# Patient Record
Sex: Female | Born: 1980
Health system: Southern US, Community
[De-identification: ages and names within clinical notes are randomized; demographics above are authoritative.]

## PROBLEM LIST (undated history)

## (undated) DIAGNOSIS — E785 Hyperlipidemia, unspecified: Secondary | ICD-10-CM

## (undated) DIAGNOSIS — H409 Unspecified glaucoma: Secondary | ICD-10-CM

## (undated) DIAGNOSIS — M329 Systemic lupus erythematosus, unspecified: Secondary | ICD-10-CM

## (undated) DIAGNOSIS — M1990 Unspecified osteoarthritis, unspecified site: Secondary | ICD-10-CM

## (undated) DIAGNOSIS — T7840XA Allergy, unspecified, initial encounter: Secondary | ICD-10-CM

## (undated) DIAGNOSIS — D649 Anemia, unspecified: Secondary | ICD-10-CM

## (undated) DIAGNOSIS — R87629 Unspecified abnormal cytological findings in specimens from vagina: Secondary | ICD-10-CM

## (undated) DIAGNOSIS — I639 Cerebral infarction, unspecified: Secondary | ICD-10-CM

## (undated) HISTORY — DX: Cerebral infarction, unspecified: I63.9

## (undated) HISTORY — PX: OTHER SURGICAL HISTORY: SHX169

## (undated) HISTORY — DX: Hyperlipidemia, unspecified: E78.5

## (undated) HISTORY — DX: Unspecified abnormal cytological findings in specimens from vagina: R87.629

## (undated) HISTORY — DX: Unspecified glaucoma: H40.9

## (undated) HISTORY — DX: Allergy, unspecified, initial encounter: T78.40XA

## (undated) HISTORY — DX: Anemia, unspecified: D64.9

## (undated) HISTORY — PX: CHOLECYSTECTOMY: SHX55

## (undated) HISTORY — DX: Unspecified osteoarthritis, unspecified site: M19.90

---

## 2016-11-15 DIAGNOSIS — M25572 Pain in left ankle and joints of left foot: Secondary | ICD-10-CM | POA: Diagnosis not present

## 2016-11-18 DIAGNOSIS — M25572 Pain in left ankle and joints of left foot: Secondary | ICD-10-CM | POA: Diagnosis not present

## 2016-12-10 DIAGNOSIS — M25572 Pain in left ankle and joints of left foot: Secondary | ICD-10-CM | POA: Diagnosis not present

## 2016-12-30 DIAGNOSIS — M25572 Pain in left ankle and joints of left foot: Secondary | ICD-10-CM | POA: Diagnosis not present

## 2017-01-02 DIAGNOSIS — M25572 Pain in left ankle and joints of left foot: Secondary | ICD-10-CM | POA: Diagnosis not present

## 2017-01-06 DIAGNOSIS — K080 Exfoliation of teeth due to systemic causes: Secondary | ICD-10-CM | POA: Diagnosis not present

## 2017-01-08 DIAGNOSIS — M25572 Pain in left ankle and joints of left foot: Secondary | ICD-10-CM | POA: Diagnosis not present

## 2017-01-09 DIAGNOSIS — M25572 Pain in left ankle and joints of left foot: Secondary | ICD-10-CM | POA: Diagnosis not present

## 2017-01-13 DIAGNOSIS — M25572 Pain in left ankle and joints of left foot: Secondary | ICD-10-CM | POA: Diagnosis not present

## 2017-01-15 DIAGNOSIS — M25572 Pain in left ankle and joints of left foot: Secondary | ICD-10-CM | POA: Diagnosis not present

## 2017-01-16 DIAGNOSIS — R1010 Upper abdominal pain, unspecified: Secondary | ICD-10-CM | POA: Diagnosis not present

## 2017-01-16 DIAGNOSIS — K8051 Calculus of bile duct without cholangitis or cholecystitis with obstruction: Secondary | ICD-10-CM | POA: Diagnosis not present

## 2017-01-16 DIAGNOSIS — R112 Nausea with vomiting, unspecified: Secondary | ICD-10-CM | POA: Diagnosis not present

## 2017-01-16 DIAGNOSIS — K8020 Calculus of gallbladder without cholecystitis without obstruction: Secondary | ICD-10-CM | POA: Diagnosis not present

## 2017-01-16 DIAGNOSIS — R1013 Epigastric pain: Secondary | ICD-10-CM | POA: Diagnosis not present

## 2017-01-16 DIAGNOSIS — R030 Elevated blood-pressure reading, without diagnosis of hypertension: Secondary | ICD-10-CM | POA: Diagnosis not present

## 2017-01-16 DIAGNOSIS — K8050 Calculus of bile duct without cholangitis or cholecystitis without obstruction: Secondary | ICD-10-CM | POA: Diagnosis not present

## 2017-01-20 DIAGNOSIS — M25572 Pain in left ankle and joints of left foot: Secondary | ICD-10-CM | POA: Diagnosis not present

## 2017-01-27 DIAGNOSIS — K8020 Calculus of gallbladder without cholecystitis without obstruction: Secondary | ICD-10-CM | POA: Diagnosis not present

## 2017-02-10 DIAGNOSIS — M76829 Posterior tibial tendinitis, unspecified leg: Secondary | ICD-10-CM | POA: Diagnosis not present

## 2017-02-10 DIAGNOSIS — M25572 Pain in left ankle and joints of left foot: Secondary | ICD-10-CM | POA: Diagnosis not present

## 2017-02-10 DIAGNOSIS — M21079 Valgus deformity, not elsewhere classified, unspecified ankle: Secondary | ICD-10-CM | POA: Diagnosis not present

## 2017-02-27 DIAGNOSIS — K8020 Calculus of gallbladder without cholecystitis without obstruction: Secondary | ICD-10-CM | POA: Diagnosis not present

## 2017-02-27 DIAGNOSIS — K819 Cholecystitis, unspecified: Secondary | ICD-10-CM | POA: Diagnosis not present

## 2017-03-07 DIAGNOSIS — M2142 Flat foot [pes planus] (acquired), left foot: Secondary | ICD-10-CM | POA: Diagnosis not present

## 2017-04-14 DIAGNOSIS — M21079 Valgus deformity, not elsewhere classified, unspecified ankle: Secondary | ICD-10-CM | POA: Diagnosis not present

## 2017-04-14 DIAGNOSIS — Z3202 Encounter for pregnancy test, result negative: Secondary | ICD-10-CM | POA: Diagnosis not present

## 2017-04-14 DIAGNOSIS — Z0000 Encounter for general adult medical examination without abnormal findings: Secondary | ICD-10-CM | POA: Diagnosis not present

## 2017-04-14 DIAGNOSIS — M76829 Posterior tibial tendinitis, unspecified leg: Secondary | ICD-10-CM | POA: Diagnosis not present

## 2017-04-14 DIAGNOSIS — Z309 Encounter for contraceptive management, unspecified: Secondary | ICD-10-CM | POA: Diagnosis not present

## 2017-04-14 DIAGNOSIS — M25572 Pain in left ankle and joints of left foot: Secondary | ICD-10-CM | POA: Diagnosis not present

## 2017-04-14 DIAGNOSIS — Z113 Encounter for screening for infections with a predominantly sexual mode of transmission: Secondary | ICD-10-CM | POA: Diagnosis not present

## 2017-06-24 DIAGNOSIS — M25572 Pain in left ankle and joints of left foot: Secondary | ICD-10-CM | POA: Diagnosis not present

## 2017-07-21 DIAGNOSIS — K080 Exfoliation of teeth due to systemic causes: Secondary | ICD-10-CM | POA: Diagnosis not present

## 2018-03-30 DIAGNOSIS — D22111 Melanocytic nevi of right upper eyelid, including canthus: Secondary | ICD-10-CM | POA: Diagnosis not present

## 2018-04-23 DIAGNOSIS — R87610 Atypical squamous cells of undetermined significance on cytologic smear of cervix (ASC-US): Secondary | ICD-10-CM | POA: Diagnosis not present

## 2018-04-23 DIAGNOSIS — Z01419 Encounter for gynecological examination (general) (routine) without abnormal findings: Secondary | ICD-10-CM | POA: Diagnosis not present

## 2018-04-24 ENCOUNTER — Other Ambulatory Visit: Payer: Self-pay | Admitting: Obstetrics & Gynecology

## 2018-04-24 DIAGNOSIS — Z1231 Encounter for screening mammogram for malignant neoplasm of breast: Secondary | ICD-10-CM

## 2018-05-14 DIAGNOSIS — R87810 Cervical high risk human papillomavirus (HPV) DNA test positive: Secondary | ICD-10-CM | POA: Diagnosis not present

## 2018-05-14 DIAGNOSIS — Z3202 Encounter for pregnancy test, result negative: Secondary | ICD-10-CM | POA: Diagnosis not present

## 2018-05-14 DIAGNOSIS — N72 Inflammatory disease of cervix uteri: Secondary | ICD-10-CM | POA: Diagnosis not present

## 2018-05-14 DIAGNOSIS — R87610 Atypical squamous cells of undetermined significance on cytologic smear of cervix (ASC-US): Secondary | ICD-10-CM | POA: Diagnosis not present

## 2018-06-16 ENCOUNTER — Ambulatory Visit
Admission: RE | Admit: 2018-06-16 | Discharge: 2018-06-16 | Disposition: A | Discharge status: Home / Self Care | Payer: Federal, State, Local not specified - PPO | Source: Ambulatory Visit | Attending: Obstetrics & Gynecology | Admitting: Obstetrics & Gynecology

## 2018-06-16 ENCOUNTER — Other Ambulatory Visit: Payer: Self-pay

## 2018-06-16 DIAGNOSIS — Z1231 Encounter for screening mammogram for malignant neoplasm of breast: Secondary | ICD-10-CM | POA: Diagnosis not present

## 2018-06-16 DIAGNOSIS — Z8249 Family history of ischemic heart disease and other diseases of the circulatory system: Secondary | ICD-10-CM | POA: Diagnosis not present

## 2018-06-16 DIAGNOSIS — Z833 Family history of diabetes mellitus: Secondary | ICD-10-CM | POA: Diagnosis not present

## 2018-06-16 DIAGNOSIS — R635 Abnormal weight gain: Secondary | ICD-10-CM | POA: Diagnosis not present

## 2018-06-18 ENCOUNTER — Other Ambulatory Visit: Payer: Self-pay | Admitting: Obstetrics & Gynecology

## 2018-06-18 DIAGNOSIS — R928 Other abnormal and inconclusive findings on diagnostic imaging of breast: Secondary | ICD-10-CM

## 2018-06-25 ENCOUNTER — Ambulatory Visit
Admission: RE | Admit: 2018-06-25 | Discharge: 2018-06-25 | Disposition: A | Discharge status: Home / Self Care | Payer: Federal, State, Local not specified - PPO | Source: Ambulatory Visit | Attending: Obstetrics & Gynecology | Admitting: Obstetrics & Gynecology

## 2018-06-25 ENCOUNTER — Other Ambulatory Visit: Payer: Self-pay

## 2018-06-25 DIAGNOSIS — R928 Other abnormal and inconclusive findings on diagnostic imaging of breast: Secondary | ICD-10-CM

## 2018-06-25 DIAGNOSIS — N6012 Diffuse cystic mastopathy of left breast: Secondary | ICD-10-CM | POA: Diagnosis not present

## 2018-06-25 DIAGNOSIS — N6459 Other signs and symptoms in breast: Secondary | ICD-10-CM | POA: Diagnosis not present

## 2018-07-07 DIAGNOSIS — Z833 Family history of diabetes mellitus: Secondary | ICD-10-CM | POA: Diagnosis not present

## 2018-07-07 DIAGNOSIS — R635 Abnormal weight gain: Secondary | ICD-10-CM | POA: Diagnosis not present

## 2018-07-07 DIAGNOSIS — Z8249 Family history of ischemic heart disease and other diseases of the circulatory system: Secondary | ICD-10-CM | POA: Diagnosis not present

## 2018-07-13 DIAGNOSIS — Z833 Family history of diabetes mellitus: Secondary | ICD-10-CM | POA: Diagnosis not present

## 2018-07-13 DIAGNOSIS — Z0000 Encounter for general adult medical examination without abnormal findings: Secondary | ICD-10-CM | POA: Diagnosis not present

## 2018-07-13 DIAGNOSIS — R635 Abnormal weight gain: Secondary | ICD-10-CM | POA: Diagnosis not present

## 2018-11-13 DIAGNOSIS — R87612 Low grade squamous intraepithelial lesion on cytologic smear of cervix (LGSIL): Secondary | ICD-10-CM | POA: Diagnosis not present

## 2018-11-13 DIAGNOSIS — N879 Dysplasia of cervix uteri, unspecified: Secondary | ICD-10-CM | POA: Diagnosis not present

## 2019-05-18 ENCOUNTER — Other Ambulatory Visit: Payer: Self-pay | Admitting: Obstetrics & Gynecology

## 2019-05-18 DIAGNOSIS — Z1231 Encounter for screening mammogram for malignant neoplasm of breast: Secondary | ICD-10-CM

## 2019-06-07 DIAGNOSIS — Z6841 Body Mass Index (BMI) 40.0 and over, adult: Secondary | ICD-10-CM | POA: Diagnosis not present

## 2019-06-07 DIAGNOSIS — Z0000 Encounter for general adult medical examination without abnormal findings: Secondary | ICD-10-CM | POA: Diagnosis not present

## 2019-06-07 DIAGNOSIS — Z833 Family history of diabetes mellitus: Secondary | ICD-10-CM | POA: Diagnosis not present

## 2019-06-07 DIAGNOSIS — Z23 Encounter for immunization: Secondary | ICD-10-CM | POA: Diagnosis not present

## 2019-06-08 DIAGNOSIS — R87810 Cervical high risk human papillomavirus (HPV) DNA test positive: Secondary | ICD-10-CM | POA: Insufficient documentation

## 2019-06-08 DIAGNOSIS — R928 Other abnormal and inconclusive findings on diagnostic imaging of breast: Secondary | ICD-10-CM | POA: Insufficient documentation

## 2019-06-08 DIAGNOSIS — Z8249 Family history of ischemic heart disease and other diseases of the circulatory system: Secondary | ICD-10-CM | POA: Insufficient documentation

## 2019-06-08 DIAGNOSIS — E66812 Obesity, class 2: Secondary | ICD-10-CM | POA: Insufficient documentation

## 2019-06-08 DIAGNOSIS — Z833 Family history of diabetes mellitus: Secondary | ICD-10-CM | POA: Insufficient documentation

## 2019-06-11 DIAGNOSIS — Z83438 Family history of other disorder of lipoprotein metabolism and other lipidemia: Secondary | ICD-10-CM | POA: Diagnosis not present

## 2019-06-11 DIAGNOSIS — Z833 Family history of diabetes mellitus: Secondary | ICD-10-CM | POA: Diagnosis not present

## 2019-06-11 DIAGNOSIS — Z0000 Encounter for general adult medical examination without abnormal findings: Secondary | ICD-10-CM | POA: Diagnosis not present

## 2019-06-18 ENCOUNTER — Ambulatory Visit
Admission: RE | Admit: 2019-06-18 | Discharge: 2019-06-18 | Disposition: A | Discharge status: Home / Self Care | Payer: Federal, State, Local not specified - PPO | Source: Ambulatory Visit | Attending: Obstetrics & Gynecology | Admitting: Obstetrics & Gynecology

## 2019-06-18 ENCOUNTER — Other Ambulatory Visit: Payer: Self-pay

## 2019-06-18 DIAGNOSIS — Z1231 Encounter for screening mammogram for malignant neoplasm of breast: Secondary | ICD-10-CM

## 2019-07-13 DIAGNOSIS — Z20822 Contact with and (suspected) exposure to covid-19: Secondary | ICD-10-CM | POA: Diagnosis not present

## 2019-08-07 IMAGING — US ULTRASOUND LEFT BREAST LIMITED
1 series · 14 of 15 positions shown · non-contrast
Comparison: Baseline screening mammogram dated 06/16/2018.

CLINICAL DATA: Patient returns today to evaluate masses identified
within each breast on baseline screening mammogram.

EXAM:
ULTRASOUND OF THE BILATERAL BREAST

[Series 1: ultrasound left breast limited · 0.06mm/px · 14 of 15 slices shown]
[im 1/15]
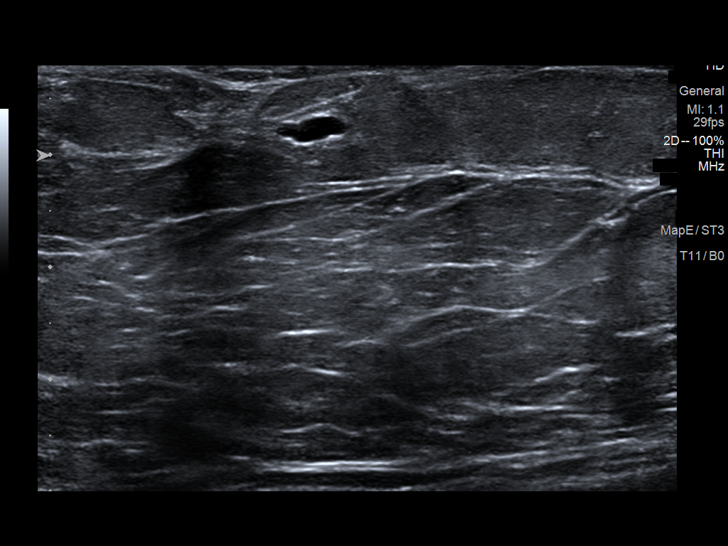
[im 2/15]
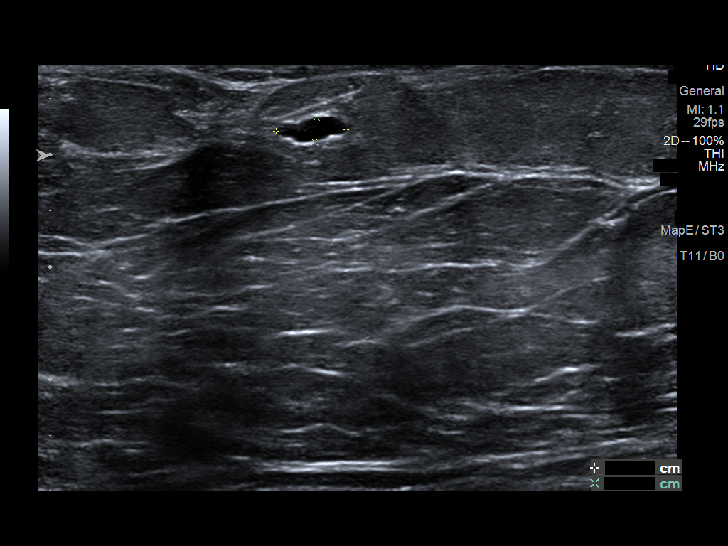
[im 3/15]
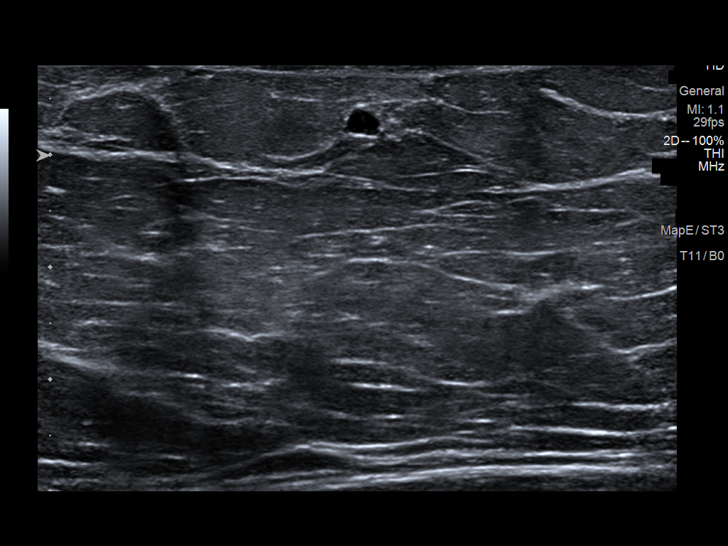
[im 4/15]
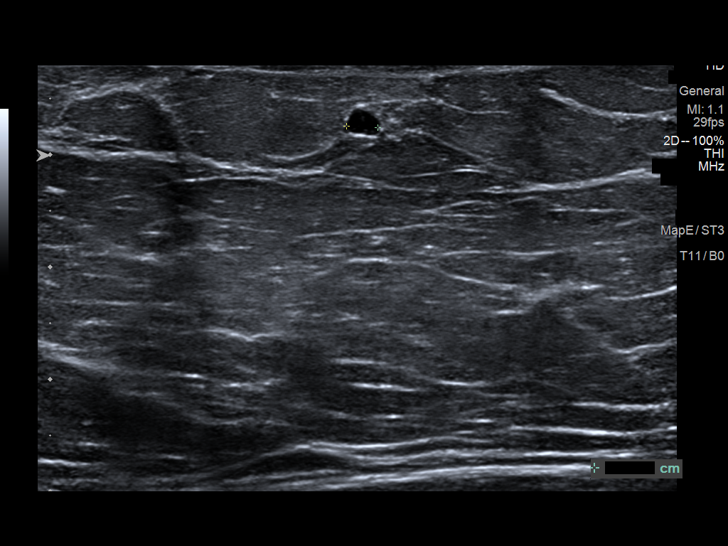
[im 5/15]
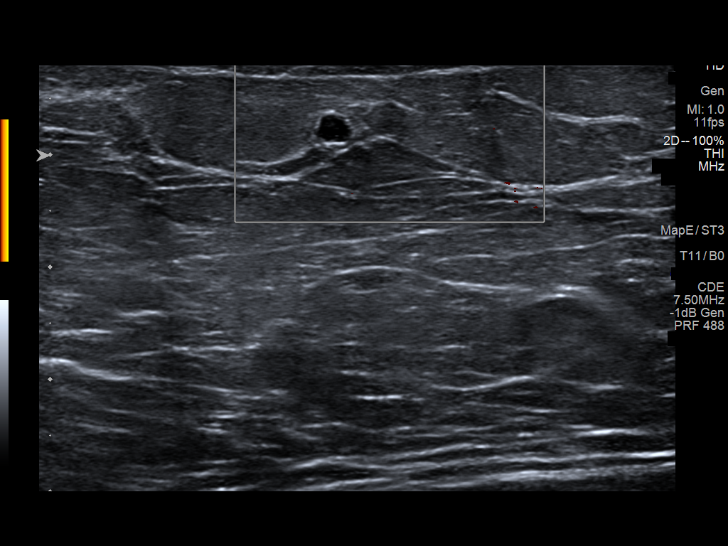
[im 6/15]
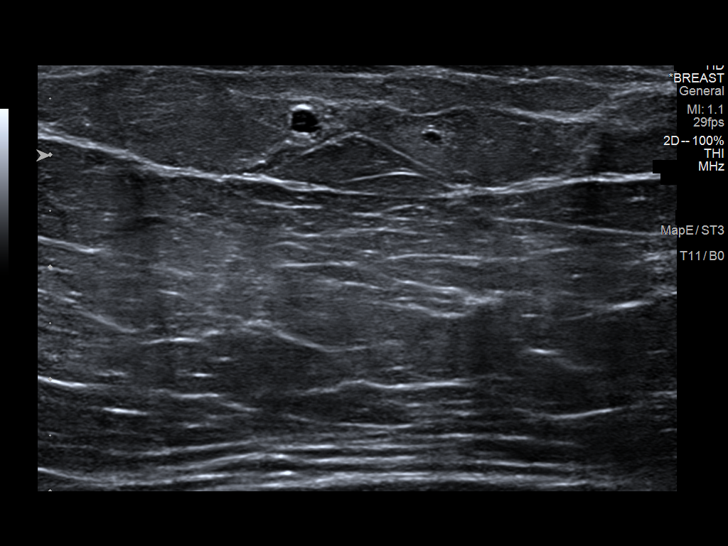
[im 7/15]
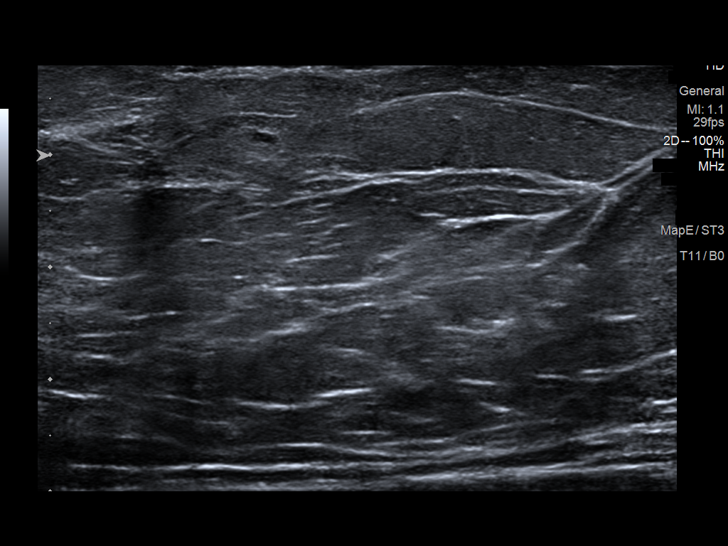
[im 9/15]
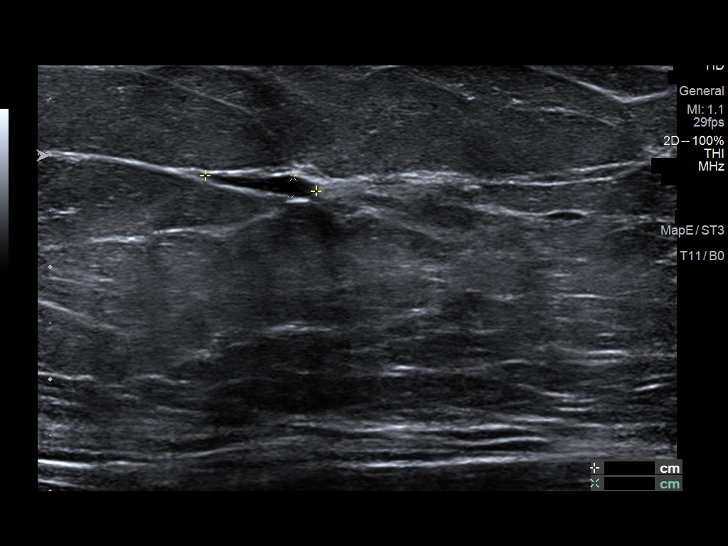
[im 10/15]
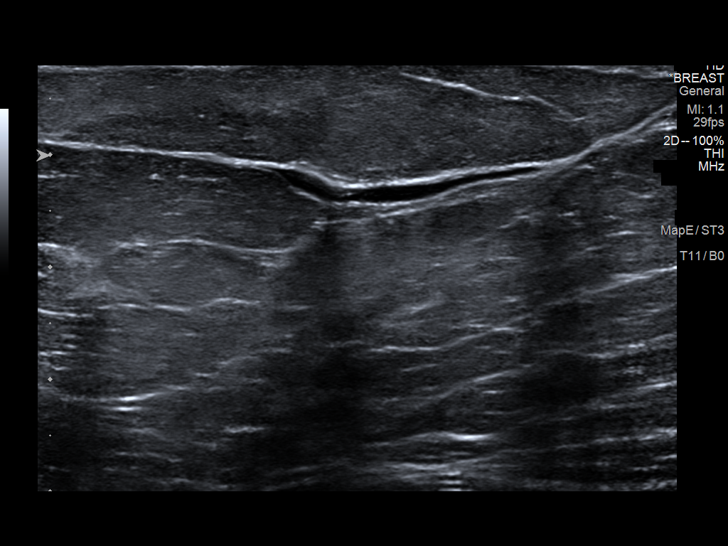
[im 11/15]
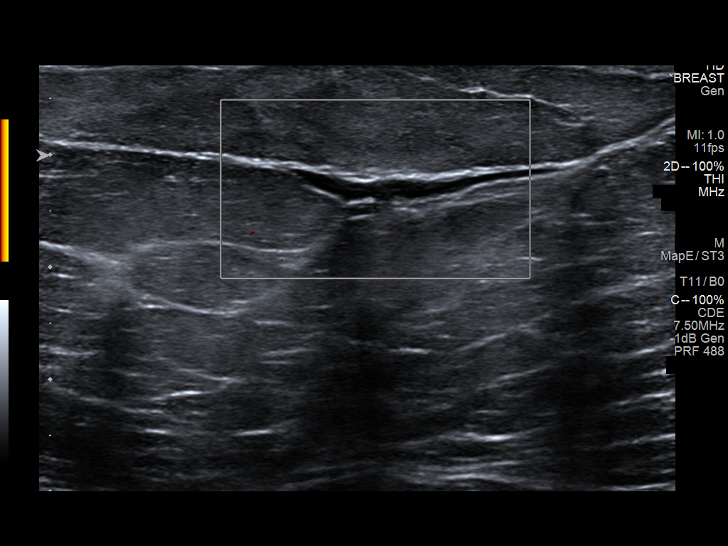
[im 12/15]
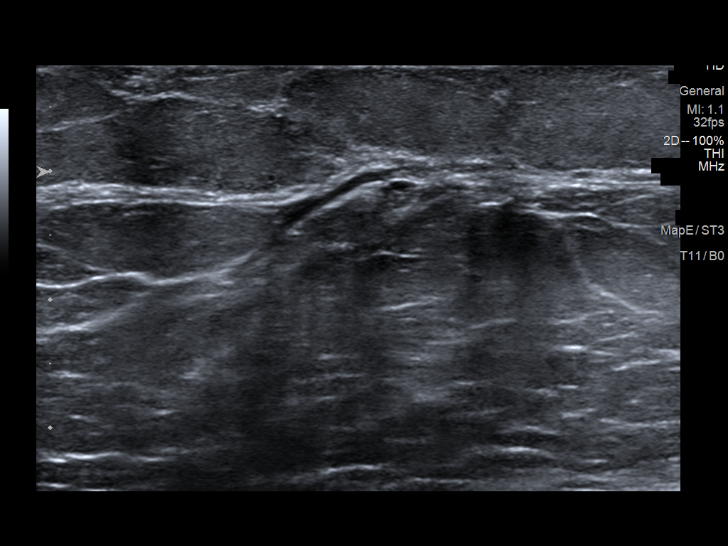
[im 13/15]
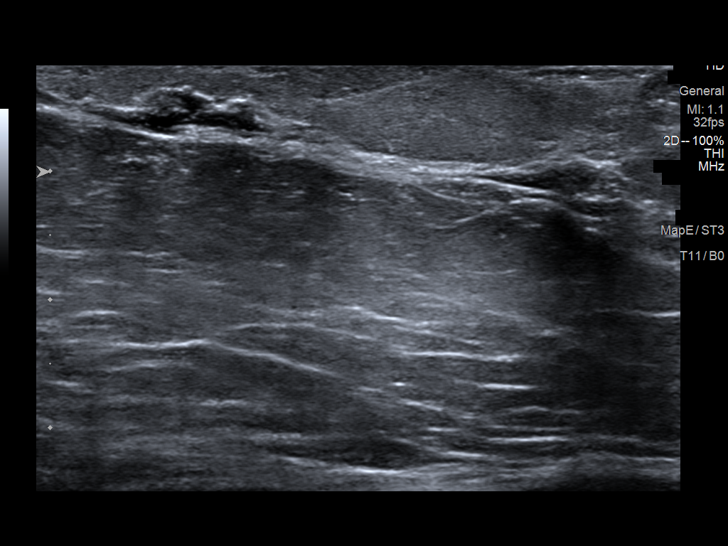
[im 14/15]
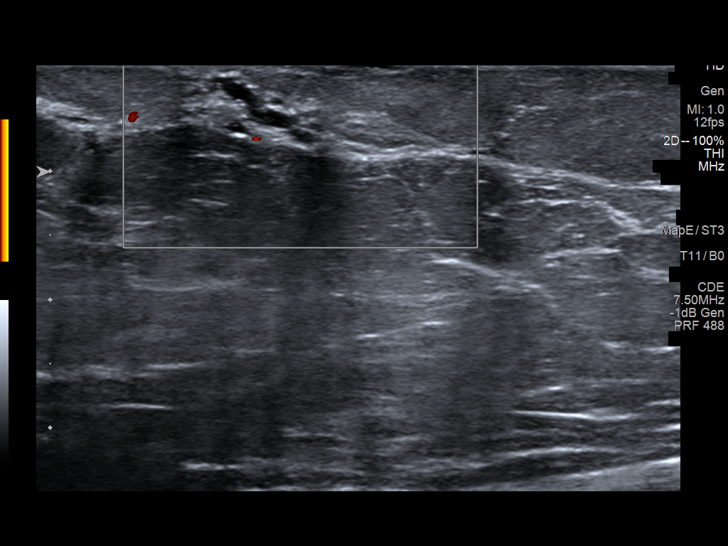
[im 15/15]
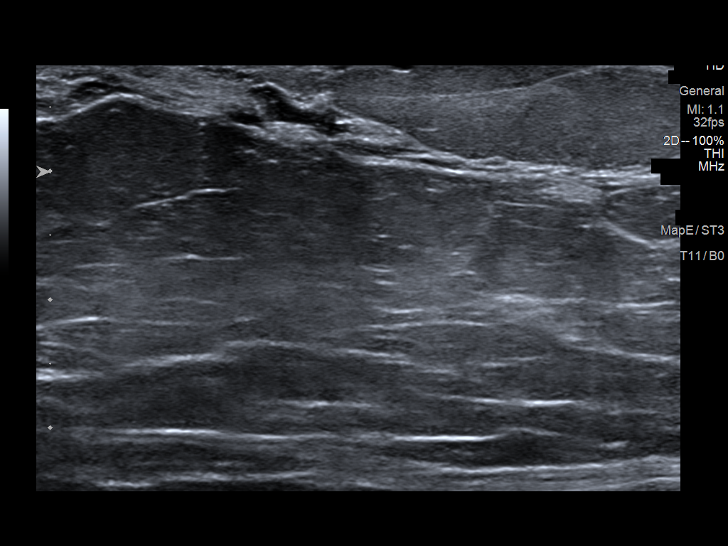

[14 of 15 positions shown; findings below may reference images not displayed]

FINDINGS: RIGHT breast: Targeted ultrasound is performed, showing 2
morphologically normal intramammary lymph nodes in the outer RIGHT
breast. No suspicious solid or cystic mass.

LEFT breast: Targeted ultrasound is performed, showing multiple
benign cysts, corresponding to the mammographic findings. No
suspicious solid or cystic mass.
IMPRESSION: No evidence of malignancy within either breast. Benign lymph nodes
within the outer RIGHT breast and benign cysts within the LEFT
breast.

Patient may return to routine annual bilateral screening mammogram
schedule.

RECOMMENDATION:
Screening mammogram in one year.(Code:CN-B-506)

I have discussed the findings and recommendations with the patient.
Results were also provided in writing at the conclusion of the
visit. If applicable, a reminder letter will be sent to the patient
regarding the next appointment.

BI-RADS CATEGORY  2: Benign.

## 2019-12-09 DIAGNOSIS — E7800 Pure hypercholesterolemia, unspecified: Secondary | ICD-10-CM | POA: Diagnosis not present

## 2019-12-09 DIAGNOSIS — Z6841 Body Mass Index (BMI) 40.0 and over, adult: Secondary | ICD-10-CM | POA: Diagnosis not present

## 2019-12-09 DIAGNOSIS — R748 Abnormal levels of other serum enzymes: Secondary | ICD-10-CM | POA: Diagnosis not present

## 2019-12-14 DIAGNOSIS — Z01419 Encounter for gynecological examination (general) (routine) without abnormal findings: Secondary | ICD-10-CM | POA: Diagnosis not present

## 2019-12-14 DIAGNOSIS — R87610 Atypical squamous cells of undetermined significance on cytologic smear of cervix (ASC-US): Secondary | ICD-10-CM | POA: Diagnosis not present

## 2020-03-14 DIAGNOSIS — Z6841 Body Mass Index (BMI) 40.0 and over, adult: Secondary | ICD-10-CM | POA: Diagnosis not present

## 2020-03-14 DIAGNOSIS — E7800 Pure hypercholesterolemia, unspecified: Secondary | ICD-10-CM | POA: Insufficient documentation

## 2020-03-14 DIAGNOSIS — R748 Abnormal levels of other serum enzymes: Secondary | ICD-10-CM | POA: Diagnosis not present

## 2020-05-17 ENCOUNTER — Other Ambulatory Visit: Payer: Self-pay | Admitting: Internal Medicine

## 2020-05-17 DIAGNOSIS — I82402 Acute embolism and thrombosis of unspecified deep veins of left lower extremity: Secondary | ICD-10-CM

## 2020-06-13 DIAGNOSIS — E7800 Pure hypercholesterolemia, unspecified: Secondary | ICD-10-CM | POA: Diagnosis not present

## 2020-06-13 DIAGNOSIS — Z8249 Family history of ischemic heart disease and other diseases of the circulatory system: Secondary | ICD-10-CM | POA: Diagnosis not present

## 2020-06-13 DIAGNOSIS — Z1159 Encounter for screening for other viral diseases: Secondary | ICD-10-CM | POA: Diagnosis not present

## 2020-06-13 DIAGNOSIS — Z833 Family history of diabetes mellitus: Secondary | ICD-10-CM | POA: Diagnosis not present

## 2020-06-13 DIAGNOSIS — Z0000 Encounter for general adult medical examination without abnormal findings: Secondary | ICD-10-CM | POA: Diagnosis not present

## 2020-06-13 DIAGNOSIS — Z6841 Body Mass Index (BMI) 40.0 and over, adult: Secondary | ICD-10-CM | POA: Diagnosis not present

## 2020-06-13 DIAGNOSIS — D649 Anemia, unspecified: Secondary | ICD-10-CM | POA: Diagnosis not present

## 2020-06-13 DIAGNOSIS — R87810 Cervical high risk human papillomavirus (HPV) DNA test positive: Secondary | ICD-10-CM | POA: Diagnosis not present

## 2020-06-13 DIAGNOSIS — Z9049 Acquired absence of other specified parts of digestive tract: Secondary | ICD-10-CM | POA: Diagnosis not present

## 2020-07-06 ENCOUNTER — Ambulatory Visit: Payer: Federal, State, Local not specified - PPO

## 2020-08-24 ENCOUNTER — Other Ambulatory Visit: Payer: Self-pay

## 2020-08-24 ENCOUNTER — Ambulatory Visit
Admission: RE | Admit: 2020-08-24 | Discharge: 2020-08-24 | Disposition: A | Discharge status: Home / Self Care | Payer: Federal, State, Local not specified - PPO | Source: Ambulatory Visit | Attending: Internal Medicine | Admitting: Internal Medicine

## 2020-08-24 DIAGNOSIS — Z1231 Encounter for screening mammogram for malignant neoplasm of breast: Secondary | ICD-10-CM | POA: Diagnosis not present

## 2020-08-24 DIAGNOSIS — I82402 Acute embolism and thrombosis of unspecified deep veins of left lower extremity: Secondary | ICD-10-CM

## 2020-12-15 ENCOUNTER — Encounter: Payer: Self-pay | Admitting: Family Medicine

## 2020-12-15 ENCOUNTER — Other Ambulatory Visit: Payer: Self-pay

## 2020-12-15 ENCOUNTER — Ambulatory Visit (INDEPENDENT_AMBULATORY_CARE_PROVIDER_SITE_OTHER): Payer: Federal, State, Local not specified - PPO | Admitting: Family Medicine

## 2020-12-15 ENCOUNTER — Other Ambulatory Visit (HOSPITAL_COMMUNITY)
Admission: RE | Admit: 2020-12-15 | Discharge: 2020-12-15 | Disposition: A | Discharge status: Home / Self Care | Payer: Federal, State, Local not specified - PPO | Source: Ambulatory Visit | Attending: Family Medicine | Admitting: Family Medicine

## 2020-12-15 VITALS — BP 118/74 | HR 73 | Ht 65.0 in | Wt 241.0 lb

## 2020-12-15 DIAGNOSIS — Z01419 Encounter for gynecological examination (general) (routine) without abnormal findings: Secondary | ICD-10-CM | POA: Diagnosis not present

## 2020-12-15 DIAGNOSIS — Z0000 Encounter for general adult medical examination without abnormal findings: Secondary | ICD-10-CM | POA: Insufficient documentation

## 2020-12-15 DIAGNOSIS — Z8742 Personal history of other diseases of the female genital tract: Secondary | ICD-10-CM | POA: Diagnosis not present

## 2020-12-15 MED ORDER — LEVONORGESTREL-ETHINYL ESTRAD 0.1-20 MG-MCG PO TABS: 1.0000 | ORAL_TABLET | Freq: Every day | ORAL | 3 refills | Status: DC

## 2020-12-15 NOTE — Progress Notes (Signed)
GYNECOLOGY ANNUAL PREVENTATIVE CARE ENCOUNTER NOTE  Subjective:   Margaret Hudson is a 40 y.o. G73P0010 female here for a routine annual gynecologic exam.  Current complaints: none.  She has a history of abnormal PAP smear and had a colposcopy with a biopsy a couple years ago. Her last PAP was last year and was also abnormal. It was recommended to get another biopsy, but the patient declined and she was fired from that practice. Denies abnormal vaginal bleeding, discharge, pelvic pain, problems with intercourse or other gynecologic concerns.    Gynecologic History Patient's last menstrual period was 12/15/2020. Patient is not sexually active  Contraception: OCP (estrogen/progesterone) Last Pap: 2021. Results were: abnormal Last mammogram: 2022. Results were: normal   Obstetric History OB History  Gravida Para Term Preterm AB Living  1       1    SAB IAB Ectopic Multiple Live Births    1          # Outcome Date GA Lbr Len/2nd Weight Sex Delivery Anes PTL Lv  1 IAB             Past Medical History:  Diagnosis Date   Vaginal Pap smear, abnormal     Past Surgical History:  Procedure Laterality Date   galbladder      Current Outpatient Medications on File Prior to Visit  Medication Sig Dispense Refill   Cholecalciferol 25 MCG (1000 UT) capsule      levonorgestrel-ethinyl estradiol (ALESSE) 0.1-20 MG-MCG tablet      Multiple Vitamin (MULTIVITAMIN) tablet Take 1 tablet by mouth daily.     No current facility-administered medications on file prior to visit.    Not on File  Social History   Socioeconomic History   Marital status: Single    Spouse name: Not on file   Number of children: Not on file   Years of education: Not on file   Highest education level: Master's degree (e.g., MA, MS, MEng, MEd, MSW, MBA)  Occupational History   Not on file  Tobacco Use   Smoking status: Never   Smokeless tobacco: Never  Vaping Use   Vaping Use: Never used  Substance  and Sexual Activity   Alcohol use: Yes    Alcohol/week: 1.0 standard drink    Types: 1 Glasses of wine per week   Drug use: Never   Sexual activity: Not Currently  Other Topics Concern   Not on file  Social History Narrative   Not on file   Social Determinants of Health   Financial Resource Strain: Not on file  Food Insecurity: Not on file  Transportation Needs: Not on file  Physical Activity: Not on file  Stress: Not on file  Social Connections: Not on file  Intimate Partner Violence: Not on file    Family History  Problem Relation Age of Onset   Diabetes Father    Hypertension Father    Hypertension Mother    Cancer Neg Hx     The following portions of the patient's history were reviewed and updated as appropriate: allergies, current medications, past family history, past medical history, past social history, past surgical history and problem list.  Review of Systems Pertinent items are noted in HPI.   Objective:  BP 118/74   Pulse 73   Ht 5\' 5"  (1.651 m)   Wt 241 lb (109.3 kg)   LMP 12/15/2020   BMI 40.10 kg/m  Wt Readings from Last 3 Encounters:  12/15/20 241  lb (109.3 kg)     Chaperone present during exam  CONSTITUTIONAL: Well-developed, well-nourished female in no acute distress.  HENT:  Normocephalic, atraumatic, External right and left ear normal. Oropharynx is clear and moist EYES: Conjunctivae and EOM are normal. Pupils are equal, round, and reactive to light. No scleral icterus.  NECK: Normal range of motion, supple, no masses.  Normal thyroid.   CARDIOVASCULAR: Normal heart rate noted, regular rhythm RESPIRATORY: Clear to auscultation bilaterally. Effort and breath sounds normal, no problems with respiration noted. BREASTS: Symmetric in size. No masses, skin changes, nipple drainage, or lymphadenopathy. ABDOMEN: Soft, normal bowel sounds, no distention noted.  No tenderness, rebound or guarding.  PELVIC: Normal appearing external genitalia; normal  appearing vaginal mucosa and cervix.  No abnormal discharge noted.   MUSCULOSKELETAL: Normal range of motion. No tenderness.  No cyanosis, clubbing, or edema.  2+ distal pulses. SKIN: Skin is warm and dry. No rash noted. Not diaphoretic. No erythema. No pallor. NEUROLOGIC: Alert and oriented to person, place, and time. Normal reflexes, muscle tone coordination. No cranial nerve deficit noted. PSYCHIATRIC: Normal mood and affect. Normal behavior. Normal judgment and thought content.  Assessment:  Annual gynecologic examination with pap smear   Plan:  1. Well Woman Exam Will follow up results of pap smear and manage accordingly. Mammogram reviewed. Birads 1 - Cytology - PAP( Margaret Hudson)  2. History of abnormal cervical Pap smear PAP today. I discussed that if we needed to do a biopsy, then we medicate prior to biopsy.   Routine preventative health maintenance measures emphasized. Please refer to After Visit Summary for other counseling recommendations.    Margaret Celeste, DO [facility]

## 2020-12-15 NOTE — Progress Notes (Signed)
Patient reports hx of Abnormal pap smear+HPV.  Patient states she has had colposcopy with biopsy in past.  Patient last mammogram July '22.

## 2020-12-18 ENCOUNTER — Other Ambulatory Visit: Payer: Self-pay

## 2020-12-18 MED ORDER — LEVONORGESTREL-ETHINYL ESTRAD 0.1-20 MG-MCG PO TABS: 1.0000 | ORAL_TABLET | Freq: Every day | ORAL | 3 refills | Status: DC

## 2020-12-25 LAB — CYTOLOGY - PAP
Adequacy: ABNORMAL
Comment: NEGATIVE

## 2021-01-25 ENCOUNTER — Other Ambulatory Visit: Payer: Self-pay

## 2021-01-25 ENCOUNTER — Other Ambulatory Visit (HOSPITAL_COMMUNITY)
Admission: RE | Admit: 2021-01-25 | Discharge: 2021-01-25 | Disposition: A | Discharge status: Home / Self Care | Payer: Federal, State, Local not specified - PPO | Source: Ambulatory Visit | Attending: Family Medicine | Admitting: Family Medicine

## 2021-01-25 ENCOUNTER — Ambulatory Visit (INDEPENDENT_AMBULATORY_CARE_PROVIDER_SITE_OTHER): Payer: Federal, State, Local not specified - PPO | Admitting: Family Medicine

## 2021-01-25 VITALS — BP 127/79 | HR 80 | Ht 65.0 in | Wt 240.0 lb

## 2021-01-25 DIAGNOSIS — Z124 Encounter for screening for malignant neoplasm of cervix: Secondary | ICD-10-CM

## 2021-01-25 DIAGNOSIS — Z8742 Personal history of other diseases of the female genital tract: Secondary | ICD-10-CM

## 2021-01-25 NOTE — Progress Notes (Signed)
° °  Subjective:    Patient ID: Margaret Hudson, female    DOB: 1980/07/12, 40 y.o.   MRN: [id number]  HPI  Rpt PAP due to insufficient sample  Review of Systems     Objective:   Physical Exam Exam conducted with a chaperone present.  Genitourinary:    Labia:        Right: No rash, tenderness or lesion.        Left: No rash, tenderness or lesion.      Urethra: No prolapse or urethral swelling.     Vagina: No signs of injury. No vaginal discharge or tenderness.     Cervix: Friability present. No cervical motion tenderness.          Assessment & Plan:  1. History of abnormal cervical Pap smear  - Cytology - PAP( [place])

## 2021-01-25 NOTE — Progress Notes (Signed)
Repeat Pap, insufficient sample.

## 2021-01-31 LAB — CYTOLOGY - PAP
Adequacy: ABNORMAL
Comment: NEGATIVE
High risk HPV: POSITIVE — AB

## 2021-02-07 ENCOUNTER — Telehealth: Payer: Self-pay | Admitting: Family Medicine

## 2021-02-07 MED ORDER — HYDROCODONE-ACETAMINOPHEN 5-325 MG PO TABS: 1.0000 | ORAL_TABLET | Freq: Once | ORAL | 0 refills | Status: AC

## 2021-02-07 MED ORDER — LORAZEPAM 1 MG PO TABS: 1.0000 mg | ORAL_TABLET | Freq: Once | ORAL | 0 refills | Status: AC

## 2021-02-07 NOTE — Telephone Encounter (Signed)
Discussed results with patient. Recommended colposcopy with premedication with hydrocodone and ativan. Will also do cervical block to help with pain for biopsy as patient had bad previous experience. Patient agreeable. Will arrange appt.

## 2021-02-07 NOTE — Addendum Note (Signed)
Addended by: Levie Heritage on: 02/07/2021 11:57 AM   Modules accepted: Orders

## 2021-02-08 ENCOUNTER — Other Ambulatory Visit: Payer: Self-pay | Admitting: Family Medicine

## 2021-02-08 MED ORDER — HYDROCODONE-ACETAMINOPHEN 5-325 MG PO TABS: 1.0000 | ORAL_TABLET | Freq: Once | ORAL | 0 refills | Status: AC

## 2021-03-07 ENCOUNTER — Ambulatory Visit (INDEPENDENT_AMBULATORY_CARE_PROVIDER_SITE_OTHER): Payer: Federal, State, Local not specified - PPO | Admitting: Family Medicine

## 2021-03-07 ENCOUNTER — Other Ambulatory Visit: Payer: Self-pay

## 2021-03-07 ENCOUNTER — Encounter: Payer: Self-pay | Admitting: Family Medicine

## 2021-03-07 ENCOUNTER — Other Ambulatory Visit (HOSPITAL_COMMUNITY)
Admission: RE | Admit: 2021-03-07 | Discharge: 2021-03-07 | Disposition: A | Discharge status: Home / Self Care | Payer: Federal, State, Local not specified - PPO | Source: Ambulatory Visit | Attending: Family Medicine | Admitting: Family Medicine

## 2021-03-07 VITALS — BP 109/70 | HR 73 | Wt 240.0 lb

## 2021-03-07 DIAGNOSIS — Z8742 Personal history of other diseases of the female genital tract: Secondary | ICD-10-CM | POA: Insufficient documentation

## 2021-03-07 DIAGNOSIS — R87810 Cervical high risk human papillomavirus (HPV) DNA test positive: Secondary | ICD-10-CM

## 2021-03-07 DIAGNOSIS — R87619 Unspecified abnormal cytological findings in specimens from cervix uteri: Secondary | ICD-10-CM | POA: Diagnosis not present

## 2021-03-07 NOTE — Progress Notes (Signed)
Patient Name: Margaret Hudson, female   DOB: 09/18/1980, 41 y.o.  MRN: [id number]  Colposcopy Procedure Note:  G1P0010 Pregnancy status: Unknown Lab Results  Component Value Date   DIAGPAP - Non-diagnostic (A) 01/25/2021   DIAGPAP - Non-diagnostic (A) 12/15/2020   HPVHIGH Positive (A) 01/25/2021   HPVHIGH Other 12/15/2020    Cervical History: Previous Abnormal Pap: Had abnormal PAP a couple years ago.  Previous Colposcopy: 2 years ago Previous LEEP or Cryo: none  Smoking: Never Smoked Hysterectomy: No Other History:   Patient given informed consent, signed copy in the chart, time out was performed.    Exam: Vulva and Vagina grossly normal.  Cervix viewed with speculum and colposcope after application of acetic acid:  Cervix Fully Visualized. Os stenosed. Dilated with plastic dilator Squamocolumnar Junction Visibility: Not fully visualized  Acetowhite lesions: acetowhite area around os.   Other Lesions:  There is an erythematous area on the posterior surgafe of the  cervix with bleeding. No uptake with lugols  Punctation: Not present  Mosaicism: Not present Abnormal vasculature: Yes   Biopsies: 3, 6, 12, and posterior surface of cervix.  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  Will call patient with results.

## 2021-03-08 ENCOUNTER — Encounter: Payer: Self-pay | Admitting: General Practice

## 2021-03-08 LAB — SURGICAL PATHOLOGY

## 2021-03-14 ENCOUNTER — Other Ambulatory Visit: Payer: Self-pay

## 2021-03-14 ENCOUNTER — Ambulatory Visit (INDEPENDENT_AMBULATORY_CARE_PROVIDER_SITE_OTHER): Payer: Federal, State, Local not specified - PPO | Admitting: Family Medicine

## 2021-03-14 ENCOUNTER — Encounter: Payer: Self-pay | Admitting: Family Medicine

## 2021-03-14 VITALS — BP 119/50 | HR 81 | Wt 238.0 lb

## 2021-03-14 DIAGNOSIS — Z23 Encounter for immunization: Secondary | ICD-10-CM | POA: Diagnosis not present

## 2021-03-14 DIAGNOSIS — Z8742 Personal history of other diseases of the female genital tract: Secondary | ICD-10-CM

## 2021-03-14 NOTE — Progress Notes (Signed)
° °  Subjective:    Patient ID: Margaret Hudson, female    DOB: [birth year], 41 y.o.   MRN: [id number]  HPI  Patient here for f/u of colposcopy results. She had no problems after the biopsy.   Review of Systems     Objective:   Physical Exam Vitals reviewed.  Constitutional:      Appearance: Normal appearance.  Neurological:     Mental Status: She is alert.  Psychiatric:        Mood and Affect: Mood normal.        Behavior: Behavior normal.        Thought Content: Thought content normal.        Judgment: Judgment normal.       Assessment & Plan:   1. Need for HPV vaccination   2. History of abnormal cervical Pap smear    Discussed results of biopsy - CIN1. Discussed options - follow with PAP in 1 year vs treatment with cryo or LEEP. Patient opted for following with PAP. Also discussed HPV vaccination - patient amenable. Although not curative for current process, will help prevent infection with other forms of HPV.

## 2021-05-14 ENCOUNTER — Ambulatory Visit (INDEPENDENT_AMBULATORY_CARE_PROVIDER_SITE_OTHER): Payer: Federal, State, Local not specified - PPO

## 2021-05-14 VITALS — BP 119/71 | HR 70 | Wt 240.0 lb

## 2021-05-14 DIAGNOSIS — Z23 Encounter for immunization: Secondary | ICD-10-CM

## 2021-05-14 NOTE — Progress Notes (Signed)
Patient presents for her second dose of HPV. Patient reports no side effects from her first dose. Armandina Stammer RN  ?

## 2021-07-13 ENCOUNTER — Other Ambulatory Visit: Payer: Self-pay | Admitting: Internal Medicine

## 2021-07-13 DIAGNOSIS — Z1231 Encounter for screening mammogram for malignant neoplasm of breast: Secondary | ICD-10-CM

## 2021-08-13 DIAGNOSIS — L239 Allergic contact dermatitis, unspecified cause: Secondary | ICD-10-CM | POA: Diagnosis not present

## 2021-08-27 ENCOUNTER — Ambulatory Visit
Admission: RE | Admit: 2021-08-27 | Discharge: 2021-08-27 | Disposition: A | Discharge status: Home / Self Care | Payer: Federal, State, Local not specified - PPO | Source: Ambulatory Visit | Attending: Internal Medicine | Admitting: Internal Medicine

## 2021-08-27 DIAGNOSIS — Z1231 Encounter for screening mammogram for malignant neoplasm of breast: Secondary | ICD-10-CM

## 2021-08-29 ENCOUNTER — Other Ambulatory Visit: Payer: Self-pay | Admitting: Internal Medicine

## 2021-08-29 DIAGNOSIS — R928 Other abnormal and inconclusive findings on diagnostic imaging of breast: Secondary | ICD-10-CM

## 2021-09-17 ENCOUNTER — Ambulatory Visit: Payer: Federal, State, Local not specified - PPO

## 2021-09-17 ENCOUNTER — Ambulatory Visit (INDEPENDENT_AMBULATORY_CARE_PROVIDER_SITE_OTHER): Payer: Federal, State, Local not specified - PPO

## 2021-09-17 VITALS — BP 118/74 | HR 85 | Wt 238.0 lb

## 2021-09-17 DIAGNOSIS — Z23 Encounter for immunization: Secondary | ICD-10-CM

## 2021-09-17 DIAGNOSIS — L810 Postinflammatory hyperpigmentation: Secondary | ICD-10-CM | POA: Diagnosis not present

## 2021-09-17 DIAGNOSIS — L239 Allergic contact dermatitis, unspecified cause: Secondary | ICD-10-CM | POA: Diagnosis not present

## 2021-09-17 NOTE — Progress Notes (Signed)
Darcella Gasman here for  HPV    Injection.  Injection administered without complication. Patient has had all 3 injections.  Isabell Bonafede l Jentry Mcqueary, CMA 09/17/2021  3:23 PM

## 2021-09-19 ENCOUNTER — Other Ambulatory Visit: Payer: Self-pay | Admitting: Internal Medicine

## 2021-09-19 ENCOUNTER — Ambulatory Visit
Admission: RE | Admit: 2021-09-19 | Discharge: 2021-09-19 | Disposition: A | Discharge status: Home / Self Care | Payer: Federal, State, Local not specified - PPO | Source: Ambulatory Visit | Attending: Internal Medicine | Admitting: Internal Medicine

## 2021-09-19 DIAGNOSIS — R928 Other abnormal and inconclusive findings on diagnostic imaging of breast: Secondary | ICD-10-CM

## 2021-09-19 DIAGNOSIS — N6310 Unspecified lump in the right breast, unspecified quadrant: Secondary | ICD-10-CM

## 2021-09-19 DIAGNOSIS — N6320 Unspecified lump in the left breast, unspecified quadrant: Secondary | ICD-10-CM

## 2021-09-19 DIAGNOSIS — N6312 Unspecified lump in the right breast, upper inner quadrant: Secondary | ICD-10-CM | POA: Diagnosis not present

## 2021-09-19 DIAGNOSIS — R599 Enlarged lymph nodes, unspecified: Secondary | ICD-10-CM

## 2021-09-19 DIAGNOSIS — N6321 Unspecified lump in the left breast, upper outer quadrant: Secondary | ICD-10-CM | POA: Diagnosis not present

## 2021-09-28 ENCOUNTER — Ambulatory Visit
Admission: RE | Admit: 2021-09-28 | Discharge: 2021-09-28 | Disposition: A | Discharge status: Home / Self Care | Payer: Federal, State, Local not specified - PPO | Source: Ambulatory Visit | Attending: Internal Medicine | Admitting: Internal Medicine

## 2021-09-28 DIAGNOSIS — N6312 Unspecified lump in the right breast, upper inner quadrant: Secondary | ICD-10-CM | POA: Diagnosis not present

## 2021-09-28 DIAGNOSIS — N6320 Unspecified lump in the left breast, unspecified quadrant: Secondary | ICD-10-CM

## 2021-09-28 DIAGNOSIS — N6310 Unspecified lump in the right breast, unspecified quadrant: Secondary | ICD-10-CM

## 2021-09-28 DIAGNOSIS — N6012 Diffuse cystic mastopathy of left breast: Secondary | ICD-10-CM | POA: Diagnosis not present

## 2021-09-28 DIAGNOSIS — N6021 Fibroadenosis of right breast: Secondary | ICD-10-CM | POA: Diagnosis not present

## 2021-09-28 DIAGNOSIS — N6321 Unspecified lump in the left breast, upper outer quadrant: Secondary | ICD-10-CM | POA: Diagnosis not present

## 2021-09-28 HISTORY — PX: BREAST BIOPSY: SHX20

## 2021-10-06 IMAGING — MG MM DIGITAL SCREENING BILAT W/ TOMO AND CAD
6 of 10 series · 6 of 30 positions shown · non-contrast
Comparison: Previous exam(s).

CLINICAL DATA: Screening.

EXAM:
DIGITAL SCREENING BILATERAL MAMMOGRAM WITH TOMOSYNTHESIS AND CAD
TECHNIQUE: Bilateral screening digital craniocaudal and mediolateral oblique
mammograms were obtained. Bilateral screening digital breast
tomosynthesis was performed. The images were evaluated with
computer-aided detection.

[R MLO synth-2D (1 of 2)]
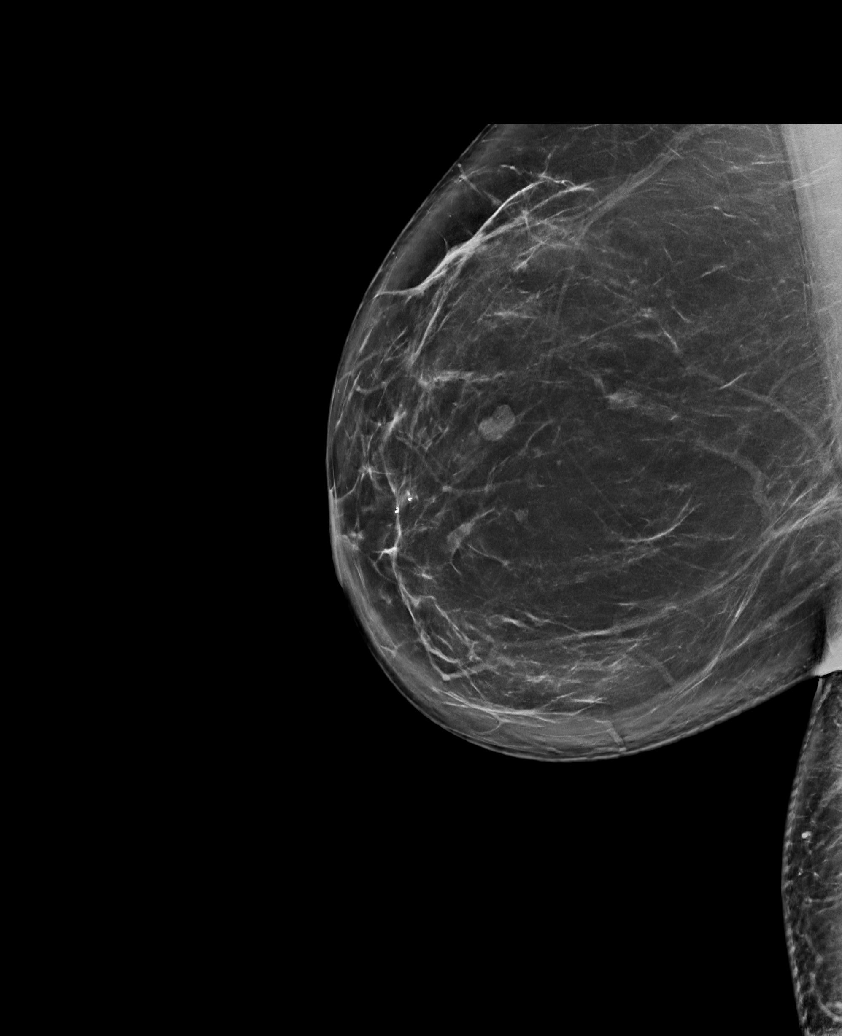

[R MLO synth-2D (2 of 2)]
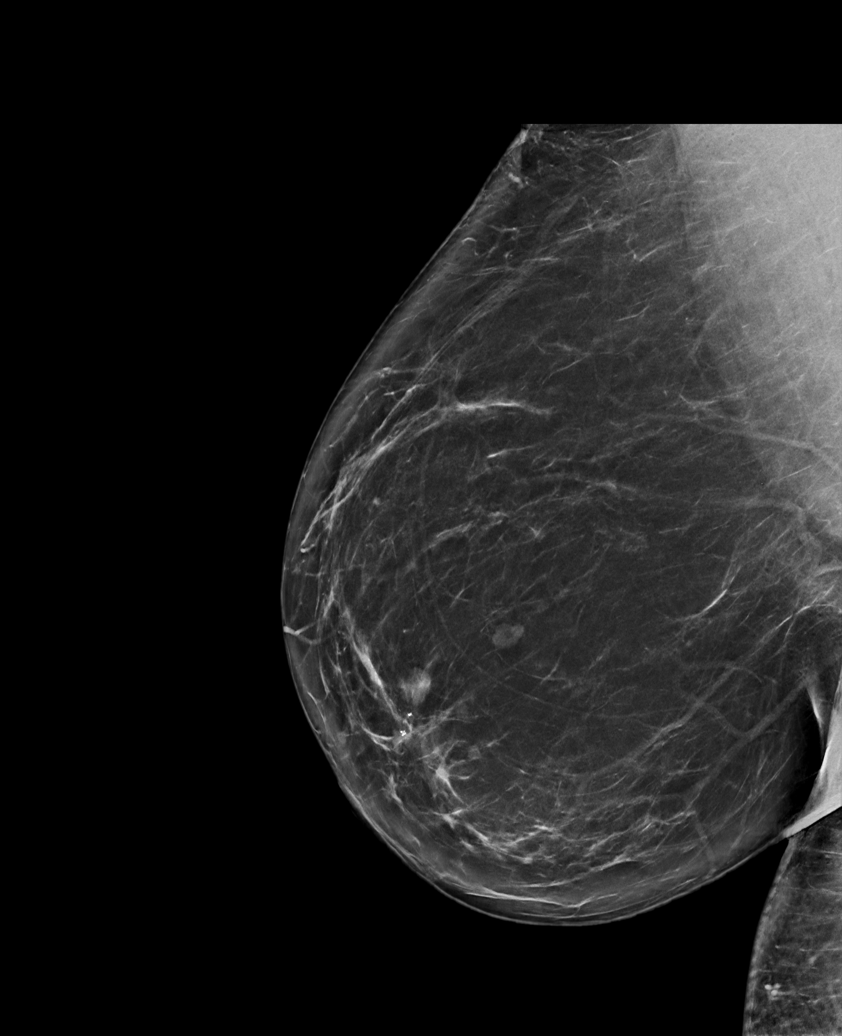

[L CC synth-2D]
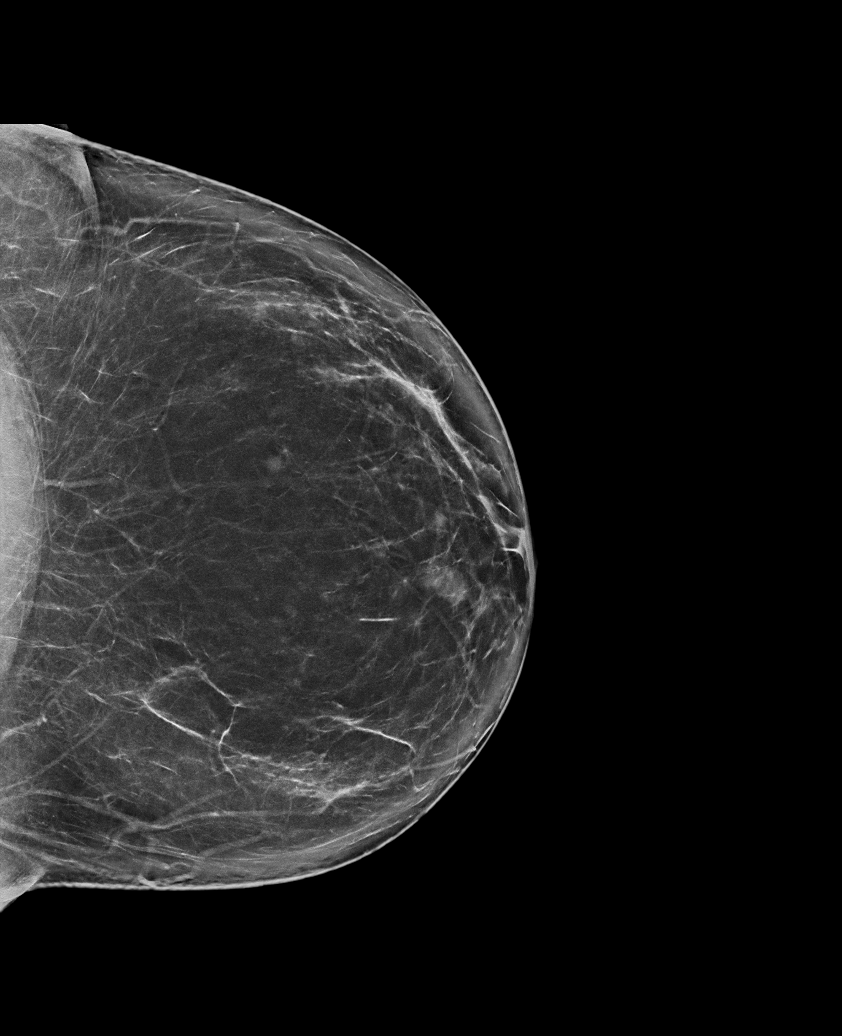

[L MLO synth-2D]
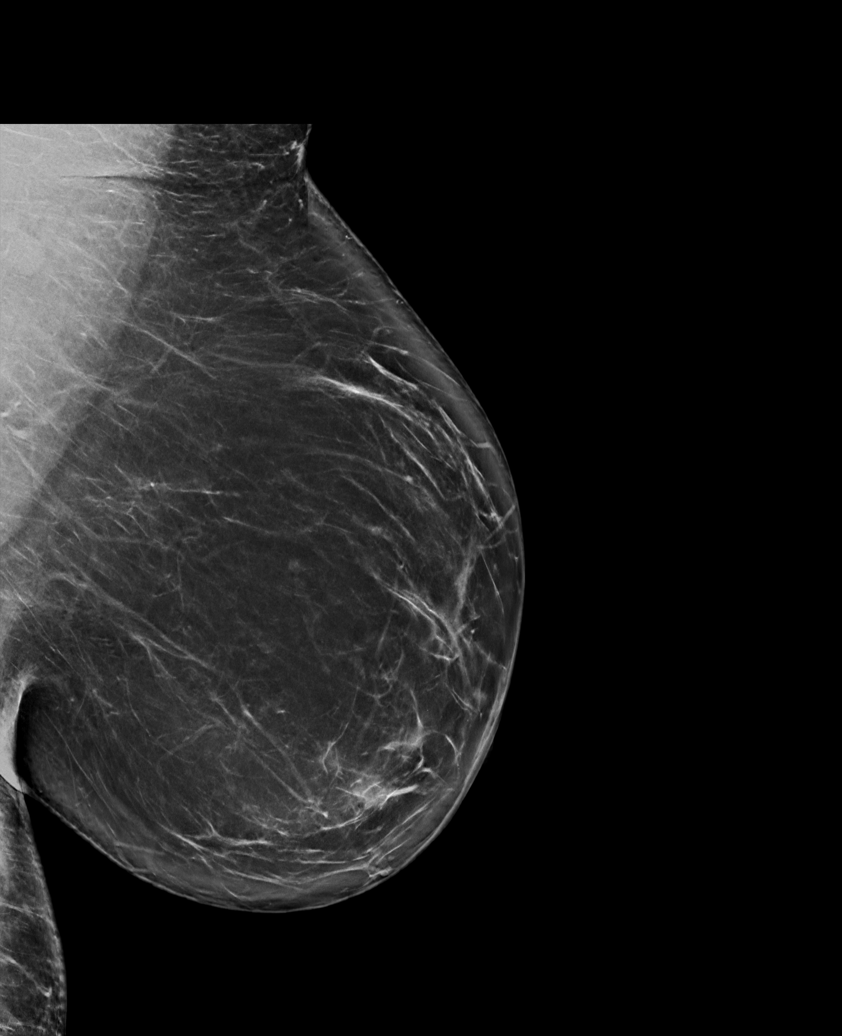

[R CC synth-2D]
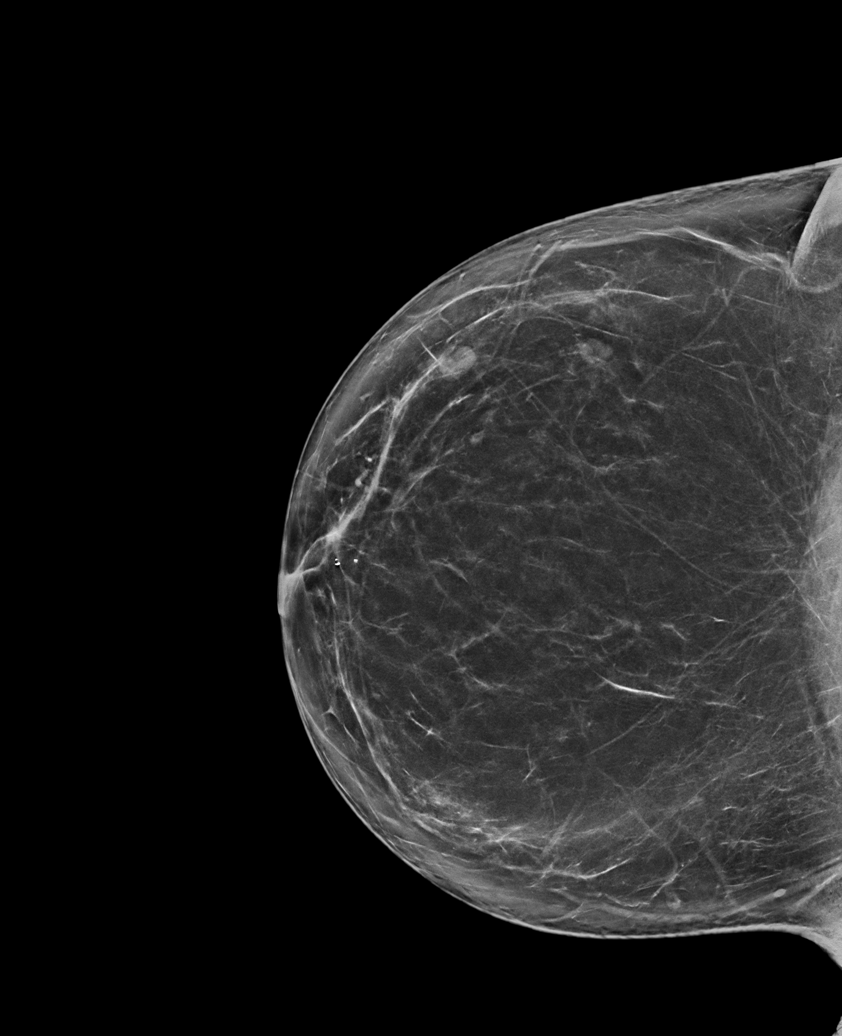

[R CC tomo · tomo slice 43/86.0]
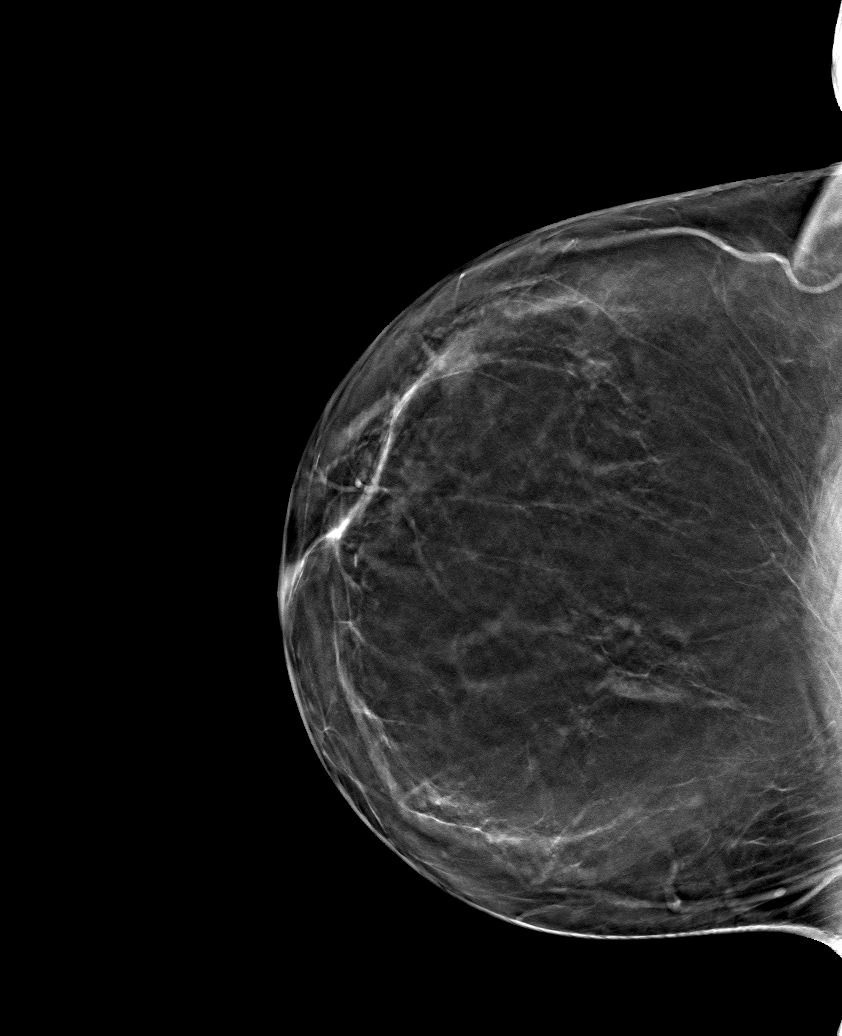

[6 of 30 positions shown; findings below may reference images not displayed]

ACR Breast Density Category b: There are scattered areas of
fibroglandular density.
FINDINGS: There are no findings suspicious for malignancy.
IMPRESSION: No mammographic evidence of malignancy. A result letter of this
screening mammogram will be mailed directly to the patient.

RECOMMENDATION:
Screening mammogram in one year. (Code:51-O-LD2)

BI-RADS CATEGORY  1: Negative.

## 2021-10-24 DIAGNOSIS — E7800 Pure hypercholesterolemia, unspecified: Secondary | ICD-10-CM | POA: Diagnosis not present

## 2021-10-24 DIAGNOSIS — D649 Anemia, unspecified: Secondary | ICD-10-CM | POA: Diagnosis not present

## 2021-10-24 DIAGNOSIS — Z6839 Body mass index (BMI) 39.0-39.9, adult: Secondary | ICD-10-CM | POA: Diagnosis not present

## 2021-10-24 DIAGNOSIS — Z23 Encounter for immunization: Secondary | ICD-10-CM | POA: Diagnosis not present

## 2021-10-24 DIAGNOSIS — Z0000 Encounter for general adult medical examination without abnormal findings: Secondary | ICD-10-CM | POA: Diagnosis not present

## 2021-11-18 ENCOUNTER — Other Ambulatory Visit: Payer: Self-pay | Admitting: Family Medicine

## 2021-12-12 ENCOUNTER — Encounter: Payer: Self-pay | Admitting: General Practice

## 2021-12-26 ENCOUNTER — Other Ambulatory Visit: Payer: Self-pay | Admitting: Internal Medicine

## 2021-12-26 ENCOUNTER — Other Ambulatory Visit: Payer: Federal, State, Local not specified - PPO

## 2021-12-26 ENCOUNTER — Ambulatory Visit
Admission: RE | Admit: 2021-12-26 | Discharge: 2021-12-26 | Disposition: A | Discharge status: Home / Self Care | Payer: Federal, State, Local not specified - PPO | Source: Ambulatory Visit | Attending: Internal Medicine | Admitting: Internal Medicine

## 2021-12-26 DIAGNOSIS — R590 Localized enlarged lymph nodes: Secondary | ICD-10-CM | POA: Diagnosis not present

## 2021-12-26 DIAGNOSIS — R599 Enlarged lymph nodes, unspecified: Secondary | ICD-10-CM

## 2022-01-01 ENCOUNTER — Ambulatory Visit
Admission: RE | Admit: 2022-01-01 | Discharge: 2022-01-01 | Disposition: A | Discharge status: Home / Self Care | Payer: Federal, State, Local not specified - PPO | Source: Ambulatory Visit | Attending: Internal Medicine | Admitting: Internal Medicine

## 2022-01-01 ENCOUNTER — Other Ambulatory Visit (HOSPITAL_COMMUNITY)
Admission: RE | Admit: 2022-01-01 | Discharge: 2022-01-01 | Disposition: A | Discharge status: Home / Self Care | Payer: Federal, State, Local not specified - PPO | Source: Ambulatory Visit | Attending: Diagnostic Radiology | Admitting: Diagnostic Radiology

## 2022-01-01 DIAGNOSIS — R599 Enlarged lymph nodes, unspecified: Secondary | ICD-10-CM

## 2022-01-01 DIAGNOSIS — R590 Localized enlarged lymph nodes: Secondary | ICD-10-CM | POA: Diagnosis not present

## 2022-01-01 HISTORY — PX: BREAST BIOPSY: SHX20

## 2022-01-10 LAB — SURGICAL PATHOLOGY

## 2022-02-28 ENCOUNTER — Ambulatory Visit (INDEPENDENT_AMBULATORY_CARE_PROVIDER_SITE_OTHER): Payer: Federal, State, Local not specified - PPO | Admitting: Family Medicine

## 2022-02-28 ENCOUNTER — Encounter: Payer: Self-pay | Admitting: Family Medicine

## 2022-02-28 VITALS — BP 116/71 | HR 78 | Ht 65.0 in | Wt 240.0 lb

## 2022-02-28 DIAGNOSIS — Z8742 Personal history of other diseases of the female genital tract: Secondary | ICD-10-CM

## 2022-02-28 DIAGNOSIS — Z01419 Encounter for gynecological examination (general) (routine) without abnormal findings: Secondary | ICD-10-CM | POA: Diagnosis not present

## 2022-02-28 DIAGNOSIS — Z0000 Encounter for general adult medical examination without abnormal findings: Secondary | ICD-10-CM

## 2022-02-28 NOTE — Progress Notes (Signed)
   ANNUAL EXAM Patient name: Margaret Hudson MRN [id number]  Date of birth: [birth year] Chief Complaint:   Annual Exam  History of Present Illness:   Margaret Hudson is a 42 y.o.  G24P0010  female  being seen today for a routine annual exam.  Current complaints: none. Having hot flashes and night sweats 2-3 times a week. Not overly bothersome. Has had breast biopsy and lymph node biopsy - both negative.  Has finished HPV vaccine series.   Patient's last menstrual period was 02/11/2022 (exact date).    Last pap 2023. Results were:  non-diagnostic, +HPV . Colposcopy CIN1 Last mammogram:       No data to display               No data to display           Review of Systems:   Pertinent items are noted in HPI Denies any headaches, blurred vision, fatigue, shortness of breath, chest pain, abdominal pain, abnormal vaginal discharge/itching/odor/irritation, problems with periods, bowel movements, urination, or intercourse unless otherwise stated above. Pertinent History Reviewed:  Reviewed past medical,surgical, social and family history.  Reviewed problem list, medications and allergies. Physical Assessment:   Vitals:   02/28/22 1017  BP: 116/71  Pulse: 78  Weight: 240 lb (108.9 kg)  Height: 5\' 5"  (1.651 m)  Body mass index is 39.94 kg/m.        Physical Examination:   General appearance - well appearing, and in no distress  Mental status - alert, oriented to person, place, and time  Psych:  She has a normal mood and affect  Skin - warm and dry, normal color, no suspicious lesions noted  Chest - effort normal, all lung fields clear to auscultation bilaterally  Heart - normal rate and regular rhythm  Neck:  midline trachea, no thyromegaly or nodules  Breasts - breasts appear normal, no suspicious masses, no skin or nipple changes or axillary nodes  Abdomen - soft, nontender, nondistended, no masses or organomegaly  Pelvic - VULVA: normal appearing  vulva with no masses, tenderness or lesions  VAGINA: normal appearing vagina with normal color and discharge, no lesions  CERVIX: erythematous cervix with hypervascularity. Scant bleeding with pap  Thin prep pap is done with HR HPV cotesting  UTERUS: uterus is felt to be normal size, shape, consistency and nontender   ADNEXA: No adnexal masses or tenderness noted.  Extremities:  No swelling or varicosities noted  Chaperone present for exam  Assessment & Plan:  1. Well woman exam without gynecological exam Does not want to start any medication for vasomotor symptoms.   2. History of abnormal cervical Pap smear PAP today.   Mammogram: due in 1 year  No orders of the defined types were placed in this encounter.   Meds: No orders of the defined types were placed in this encounter.   Follow-up: No follow-ups on file.  Truett Mainland, DO 02/28/2022 10:54 AM

## 2022-03-08 LAB — CYTOLOGY - PAP: Adequacy: ABNORMAL

## 2022-03-20 ENCOUNTER — Telehealth: Payer: Self-pay | Admitting: General Practice

## 2022-03-20 NOTE — Telephone Encounter (Signed)
Left message on VM for pt to contact our office to schedule Colposcopy per Dr. Nehemiah Settle.

## 2022-03-20 NOTE — Telephone Encounter (Signed)
-----   Message from Truett Mainland, DO sent at 03/19/2022  4:17 PM EST ----- She needs to be set up for colposcopy.

## 2022-03-21 ENCOUNTER — Encounter: Payer: Self-pay | Admitting: Family Medicine

## 2022-03-21 ENCOUNTER — Telehealth: Payer: Self-pay

## 2022-03-21 NOTE — Telephone Encounter (Signed)
-----   Message from Truett Mainland, DO sent at 03/19/2022  4:17 PM EST ----- She needs to be set up for colposcopy.

## 2022-03-21 NOTE — Telephone Encounter (Signed)
Patient called and made aware of pap smear results and need for colposcopy. Patient states that she has had one before and doesn't want to repeat it.  Asked patient to send provider a note expressing her concern. Kathrene Alu RN

## 2022-06-14 DIAGNOSIS — Z79899 Other long term (current) drug therapy: Secondary | ICD-10-CM | POA: Diagnosis not present

## 2022-06-14 DIAGNOSIS — D485 Neoplasm of uncertain behavior of skin: Secondary | ICD-10-CM | POA: Diagnosis not present

## 2022-07-15 ENCOUNTER — Other Ambulatory Visit: Payer: Self-pay | Admitting: Internal Medicine

## 2022-07-15 DIAGNOSIS — L930 Discoid lupus erythematosus: Secondary | ICD-10-CM | POA: Diagnosis not present

## 2022-07-15 DIAGNOSIS — Z1231 Encounter for screening mammogram for malignant neoplasm of breast: Secondary | ICD-10-CM

## 2022-07-31 DIAGNOSIS — M329 Systemic lupus erythematosus, unspecified: Secondary | ICD-10-CM | POA: Insufficient documentation

## 2022-07-31 DIAGNOSIS — R768 Other specified abnormal immunological findings in serum: Secondary | ICD-10-CM | POA: Diagnosis not present

## 2022-07-31 DIAGNOSIS — L930 Discoid lupus erythematosus: Secondary | ICD-10-CM | POA: Diagnosis not present

## 2022-07-31 DIAGNOSIS — M791 Myalgia, unspecified site: Secondary | ICD-10-CM | POA: Diagnosis not present

## 2022-07-31 DIAGNOSIS — M2550 Pain in unspecified joint: Secondary | ICD-10-CM | POA: Diagnosis not present

## 2022-08-14 DIAGNOSIS — L930 Discoid lupus erythematosus: Secondary | ICD-10-CM | POA: Diagnosis not present

## 2022-08-29 ENCOUNTER — Ambulatory Visit: Payer: Federal, State, Local not specified - PPO

## 2022-09-02 DIAGNOSIS — R768 Other specified abnormal immunological findings in serum: Secondary | ICD-10-CM | POA: Diagnosis not present

## 2022-09-02 DIAGNOSIS — M2550 Pain in unspecified joint: Secondary | ICD-10-CM | POA: Diagnosis not present

## 2022-09-02 DIAGNOSIS — L930 Discoid lupus erythematosus: Secondary | ICD-10-CM | POA: Diagnosis not present

## 2022-09-02 DIAGNOSIS — M791 Myalgia, unspecified site: Secondary | ICD-10-CM | POA: Diagnosis not present

## 2022-09-17 ENCOUNTER — Ambulatory Visit
Admission: RE | Admit: 2022-09-17 | Discharge: 2022-09-17 | Disposition: A | Discharge status: Home / Self Care | Payer: Federal, State, Local not specified - PPO | Source: Ambulatory Visit | Attending: Internal Medicine | Admitting: Internal Medicine

## 2022-09-17 DIAGNOSIS — Z1231 Encounter for screening mammogram for malignant neoplasm of breast: Secondary | ICD-10-CM | POA: Diagnosis not present

## 2022-09-19 ENCOUNTER — Other Ambulatory Visit: Payer: Self-pay | Admitting: Internal Medicine

## 2022-09-19 DIAGNOSIS — R928 Other abnormal and inconclusive findings on diagnostic imaging of breast: Secondary | ICD-10-CM

## 2022-09-27 ENCOUNTER — Ambulatory Visit: Payer: Federal, State, Local not specified - PPO

## 2022-09-27 ENCOUNTER — Ambulatory Visit: Admission: RE | Admit: 2022-09-27 | Payer: Federal, State, Local not specified - PPO | Source: Ambulatory Visit

## 2022-09-27 DIAGNOSIS — R928 Other abnormal and inconclusive findings on diagnostic imaging of breast: Secondary | ICD-10-CM | POA: Diagnosis not present

## 2022-10-13 ENCOUNTER — Other Ambulatory Visit: Payer: Self-pay | Admitting: Family Medicine

## 2022-12-03 DIAGNOSIS — L930 Discoid lupus erythematosus: Secondary | ICD-10-CM | POA: Diagnosis not present

## 2022-12-03 DIAGNOSIS — M2550 Pain in unspecified joint: Secondary | ICD-10-CM | POA: Diagnosis not present

## 2022-12-03 DIAGNOSIS — M329 Systemic lupus erythematosus, unspecified: Secondary | ICD-10-CM | POA: Diagnosis not present

## 2022-12-03 DIAGNOSIS — M791 Myalgia, unspecified site: Secondary | ICD-10-CM | POA: Diagnosis not present

## 2023-01-04 ENCOUNTER — Other Ambulatory Visit: Payer: Self-pay | Admitting: Family Medicine

## 2023-01-07 ENCOUNTER — Other Ambulatory Visit: Payer: Self-pay

## 2023-01-07 DIAGNOSIS — Z3040 Encounter for surveillance of contraceptives, unspecified: Secondary | ICD-10-CM

## 2023-01-07 MED ORDER — LEVONORGESTREL-ETHINYL ESTRAD 0.1-20 MG-MCG PO TABS: 1.0000 | ORAL_TABLET | Freq: Every day | ORAL | 0 refills | Status: DC

## 2023-02-10 DIAGNOSIS — M329 Systemic lupus erythematosus, unspecified: Secondary | ICD-10-CM | POA: Diagnosis not present

## 2023-02-10 DIAGNOSIS — L930 Discoid lupus erythematosus: Secondary | ICD-10-CM | POA: Diagnosis not present

## 2023-02-10 DIAGNOSIS — M2550 Pain in unspecified joint: Secondary | ICD-10-CM | POA: Diagnosis not present

## 2023-02-10 DIAGNOSIS — M25561 Pain in right knee: Secondary | ICD-10-CM | POA: Diagnosis not present

## 2023-02-10 DIAGNOSIS — M25562 Pain in left knee: Secondary | ICD-10-CM | POA: Diagnosis not present

## 2023-02-10 DIAGNOSIS — M25569 Pain in unspecified knee: Secondary | ICD-10-CM | POA: Diagnosis not present

## 2023-03-19 DIAGNOSIS — L930 Discoid lupus erythematosus: Secondary | ICD-10-CM | POA: Diagnosis not present

## 2023-03-19 DIAGNOSIS — M2550 Pain in unspecified joint: Secondary | ICD-10-CM | POA: Diagnosis not present

## 2023-03-19 DIAGNOSIS — M329 Systemic lupus erythematosus, unspecified: Secondary | ICD-10-CM | POA: Diagnosis not present

## 2023-03-19 DIAGNOSIS — M25569 Pain in unspecified knee: Secondary | ICD-10-CM | POA: Diagnosis not present

## 2023-04-15 DIAGNOSIS — M329 Systemic lupus erythematosus, unspecified: Secondary | ICD-10-CM | POA: Diagnosis not present

## 2023-04-16 ENCOUNTER — Ambulatory Visit: Payer: Federal, State, Local not specified - PPO | Admitting: Family Medicine

## 2023-04-16 ENCOUNTER — Other Ambulatory Visit (HOSPITAL_COMMUNITY)
Admission: RE | Admit: 2023-04-16 | Discharge: 2023-04-16 | Disposition: A | Discharge status: Home / Self Care | Source: Ambulatory Visit | Attending: Family Medicine | Admitting: Family Medicine

## 2023-04-16 VITALS — BP 118/84 | HR 85 | Ht 65.0 in | Wt 246.0 lb

## 2023-04-16 DIAGNOSIS — Z1339 Encounter for screening examination for other mental health and behavioral disorders: Secondary | ICD-10-CM | POA: Diagnosis not present

## 2023-04-16 DIAGNOSIS — Z124 Encounter for screening for malignant neoplasm of cervix: Secondary | ICD-10-CM | POA: Diagnosis not present

## 2023-04-16 DIAGNOSIS — Z01419 Encounter for gynecological examination (general) (routine) without abnormal findings: Secondary | ICD-10-CM | POA: Diagnosis not present

## 2023-04-16 DIAGNOSIS — Z0000 Encounter for general adult medical examination without abnormal findings: Secondary | ICD-10-CM | POA: Insufficient documentation

## 2023-04-16 DIAGNOSIS — Z8742 Personal history of other diseases of the female genital tract: Secondary | ICD-10-CM | POA: Insufficient documentation

## 2023-04-16 DIAGNOSIS — Z113 Encounter for screening for infections with a predominantly sexual mode of transmission: Secondary | ICD-10-CM | POA: Insufficient documentation

## 2023-04-16 DIAGNOSIS — Z1151 Encounter for screening for human papillomavirus (HPV): Secondary | ICD-10-CM | POA: Insufficient documentation

## 2023-04-16 NOTE — Addendum Note (Signed)
 Addended by: Mikey Bussing on: 04/16/2023 09:57 AM   Modules accepted: Orders

## 2023-04-16 NOTE — Addendum Note (Signed)
 Addended by: Mikey Bussing on: 04/16/2023 09:58 AM   Modules accepted: Orders

## 2023-04-16 NOTE — Progress Notes (Signed)
 ANNUAL EXAM Patient name: Margaret Hudson MRN [id number]  Date of birth: 26-Oct-1980 Chief Complaint:   Annual Exam  History of Present Illness:   Margaret Hudson is a 43 y.o.  G76P0010  female  being seen today for a routine annual exam.  Current complaints: none  Patient's last menstrual period was 04/07/2023 (exact date).    Last pap 2024. Results were:  unsatifactory . H/O abnormal pap: yes Last mammogram: 2024. Results were: normal.  Last colonoscopy: n/a     04/16/2023    9:18 AM  Depression screen PHQ 2/9  Decreased Interest 0  Down, Depressed, Hopeless 0  PHQ - 2 Score 0  Altered sleeping 2  Tired, decreased energy 0  Change in appetite 0  Feeling bad or failure about yourself  0  Trouble concentrating 0  Moving slowly or fidgety/restless 0  Suicidal thoughts 0  PHQ-9 Score 2        04/16/2023    9:18 AM  GAD 7 : Generalized Anxiety Score  Nervous, Anxious, on Edge 0  Control/stop worrying 0  Worry too much - different things 0  Trouble relaxing 0  Restless 0  Easily annoyed or irritable 0  Afraid - awful might happen 0  Total GAD 7 Score 0     Review of Systems:   Pertinent items are noted in HPI Denies any headaches, blurred vision, fatigue, shortness of breath, chest pain, abdominal pain, abnormal vaginal discharge/itching/odor/irritation, problems with periods, bowel movements, urination, or intercourse unless otherwise stated above. Pertinent History Reviewed:  Reviewed past medical,surgical, social and family history.  Reviewed problem list, medications and allergies. Physical Assessment:   Vitals:   04/16/23 0909  BP: 118/84  Pulse: 85  Weight: 246 lb (111.6 kg)  Height: 5\' 5"  (1.651 m)  Body mass index is 40.94 kg/m.        Physical Examination:   General appearance - well appearing, and in no distress  Mental status - alert, oriented to person, place, and time  Psych:  She has a normal mood and affect  Skin - warm  and dry, normal color, no suspicious lesions noted  Chest - effort normal, all lung fields clear to auscultation bilaterally  Heart - normal rate and regular rhythm  Neck:  midline trachea, no thyromegaly or nodules  Breasts - breasts appear normal, no suspicious masses, no skin or nipple changes or axillary nodes  Abdomen - soft, nontender, nondistended, no masses or organomegaly  Pelvic - VULVA: normal appearing vulva with no masses, tenderness or lesions  VAGINA: normal appearing vagina with normal color and discharge, no lesions  CERVIX: inflammed appearing cervix. Bleeding when touched with swab.  Thin prep pap is done with HR HPV cotesting  UTERUS: uterus is felt to be normal size, shape, consistency and nontender   ADNEXA: No adnexal masses or tenderness noted.  Extremities:  No swelling or varicosities noted  Chaperone present for exam  Assessment & Plan:  1. Well woman exam without gynecological exam (Primary) - Cytology - PAP  2. History of abnormal cervical Pap smear With her previous history of unsatisfactory PAPs, she has declined colposcopy due to previous experience and pain with the procedure. I discussed possibly doing this under anesthesia. Will get results then schedule. - Cytology - PAP   Labs/procedures today:   No orders of the defined types were placed in this encounter.   Meds: No orders of the defined types were placed in this encounter.  Follow-up: No follow-ups on file.  Levie Heritage, DO 04/16/2023 9:49 AM

## 2023-04-22 LAB — CYTOLOGY - PAP
Adequacy: ABSENT
Chlamydia: NEGATIVE
Comment: NEGATIVE
Comment: NEGATIVE
Comment: NORMAL
Diagnosis: NEGATIVE
High risk HPV: NEGATIVE
Neisseria Gonorrhea: NEGATIVE

## 2023-05-13 DIAGNOSIS — M329 Systemic lupus erythematosus, unspecified: Secondary | ICD-10-CM | POA: Diagnosis not present

## 2023-06-16 DIAGNOSIS — M329 Systemic lupus erythematosus, unspecified: Secondary | ICD-10-CM | POA: Diagnosis not present

## 2023-06-16 DIAGNOSIS — M2550 Pain in unspecified joint: Secondary | ICD-10-CM | POA: Diagnosis not present

## 2023-06-16 DIAGNOSIS — L930 Discoid lupus erythematosus: Secondary | ICD-10-CM | POA: Diagnosis not present

## 2023-06-18 ENCOUNTER — Other Ambulatory Visit: Payer: Self-pay | Admitting: Family Medicine

## 2023-06-24 DIAGNOSIS — K080 Exfoliation of teeth due to systemic causes: Secondary | ICD-10-CM | POA: Diagnosis not present

## 2023-06-27 ENCOUNTER — Ambulatory Visit: Admitting: Nurse Practitioner

## 2023-07-03 ENCOUNTER — Encounter (HOSPITAL_BASED_OUTPATIENT_CLINIC_OR_DEPARTMENT_OTHER): Payer: Self-pay | Admitting: Emergency Medicine

## 2023-07-03 ENCOUNTER — Emergency Department (HOSPITAL_BASED_OUTPATIENT_CLINIC_OR_DEPARTMENT_OTHER)

## 2023-07-03 ENCOUNTER — Emergency Department (HOSPITAL_COMMUNITY)

## 2023-07-03 ENCOUNTER — Emergency Department (HOSPITAL_BASED_OUTPATIENT_CLINIC_OR_DEPARTMENT_OTHER)
Admission: EM | Admit: 2023-07-03 | Discharge: 2023-07-03 | Disposition: A | Discharge status: Home / Self Care | Attending: Emergency Medicine | Admitting: Emergency Medicine

## 2023-07-03 ENCOUNTER — Other Ambulatory Visit: Payer: Self-pay

## 2023-07-03 DIAGNOSIS — G459 Transient cerebral ischemic attack, unspecified: Secondary | ICD-10-CM | POA: Diagnosis not present

## 2023-07-03 DIAGNOSIS — Z7982 Long term (current) use of aspirin: Secondary | ICD-10-CM | POA: Diagnosis not present

## 2023-07-03 DIAGNOSIS — R42 Dizziness and giddiness: Secondary | ICD-10-CM | POA: Diagnosis not present

## 2023-07-03 DIAGNOSIS — I6529 Occlusion and stenosis of unspecified carotid artery: Secondary | ICD-10-CM | POA: Diagnosis not present

## 2023-07-03 DIAGNOSIS — R531 Weakness: Secondary | ICD-10-CM | POA: Diagnosis not present

## 2023-07-03 DIAGNOSIS — R4701 Aphasia: Secondary | ICD-10-CM | POA: Diagnosis not present

## 2023-07-03 HISTORY — DX: Systemic lupus erythematosus, unspecified: M32.9

## 2023-07-03 LAB — HEMOGLOBIN A1C
Hgb A1c MFr Bld: 4.8 % (ref 4.8–5.6)
Mean Plasma Glucose: 91.06 mg/dL

## 2023-07-03 LAB — COMPREHENSIVE METABOLIC PANEL WITH GFR
ALT: 19 U/L (ref 0–44)
AST: 28 U/L (ref 15–41)
Albumin: 3.8 g/dL (ref 3.5–5.0)
Alkaline Phosphatase: 43 U/L (ref 38–126)
Anion gap: 10 (ref 5–15)
BUN: 10 mg/dL (ref 6–20)
CO2: 23 mmol/L (ref 22–32)
Calcium: 9.1 mg/dL (ref 8.9–10.3)
Chloride: 108 mmol/L (ref 98–111)
Creatinine, Ser: 0.88 mg/dL (ref 0.44–1.00)
GFR, Estimated: >60 mL/min (ref >60)
Glucose, Bld: 104 mg/dL — ABNORMAL HIGH (ref 70–99)
Potassium: 3.6 mmol/L (ref 3.5–5.1)
Sodium: 141 mmol/L (ref 135–145)
Total Bilirubin: 0.4 mg/dL (ref 0.0–1.2)
Total Protein: 6.9 g/dL (ref 6.5–8.1)

## 2023-07-03 LAB — URINALYSIS, ROUTINE W REFLEX MICROSCOPIC
Bilirubin Urine: NEGATIVE
Glucose, UA: NEGATIVE mg/dL
Ketones, ur: NEGATIVE mg/dL
Nitrite: NEGATIVE
Protein, ur: NEGATIVE mg/dL
Specific Gravity, Urine: >=1.03 (ref 1.005–1.030)
pH: 5.5 (ref 5.0–8.0)

## 2023-07-03 LAB — CBC WITH DIFFERENTIAL/PLATELET
Abs Immature Granulocytes: 0.01 10*3/uL (ref 0.00–0.07)
Basophils Absolute: 0 10*3/uL (ref 0.0–0.1)
Basophils Relative: 0 %
Eosinophils Absolute: 0.1 10*3/uL (ref 0.0–0.5)
Eosinophils Relative: 2 %
HCT: 36.2 % (ref 36.0–46.0)
Hemoglobin: 11.3 g/dL — ABNORMAL LOW (ref 12.0–15.0)
Immature Granulocytes: 0 %
Lymphocytes Relative: 39 %
Lymphs Abs: 1.6 10*3/uL (ref 0.7–4.0)
MCH: 24.4 pg — ABNORMAL LOW (ref 26.0–34.0)
MCHC: 31.2 g/dL (ref 30.0–36.0)
MCV: 78 fL — ABNORMAL LOW (ref 80.0–100.0)
Monocytes Absolute: 0.5 10*3/uL (ref 0.1–1.0)
Monocytes Relative: 13 %
Neutro Abs: 1.9 10*3/uL (ref 1.7–7.7)
Neutrophils Relative %: 46 %
Platelets: 267 10*3/uL (ref 150–400)
RBC: 4.64 MIL/uL (ref 3.87–5.11)
RDW: 14.1 % (ref 11.5–15.5)
WBC: 4.2 10*3/uL (ref 4.0–10.5)
nRBC: 0 % (ref 0.0–0.2)

## 2023-07-03 LAB — TSH: TSH: 1.64 u[IU]/mL (ref 0.350–4.500)

## 2023-07-03 LAB — PREGNANCY, URINE: Preg Test, Ur: NEGATIVE

## 2023-07-03 LAB — URINALYSIS, MICROSCOPIC (REFLEX)

## 2023-07-03 LAB — T4, FREE: Free T4: 0.79 ng/dL (ref 0.61–1.12)

## 2023-07-03 LAB — TROPONIN T, HIGH SENSITIVITY: Troponin T High Sensitivity: <15 ng/L (ref <19)

## 2023-07-03 LAB — MAGNESIUM: Magnesium: 1.9 mg/dL (ref 1.7–2.4)

## 2023-07-03 MED ORDER — SODIUM CHLORIDE 0.9 % IV BOLUS
1000.0000 mL | Freq: Once | INTRAVENOUS | Status: AC
Start: 2023-07-03 — End: 2023-07-03
  Administered 2023-07-03: 1000 mL via INTRAVENOUS

## 2023-07-03 MED ORDER — ASPIRIN 81 MG PO CHEW: 81.0000 mg | CHEWABLE_TABLET | Freq: Every day | ORAL | 0 refills | Status: AC

## 2023-07-03 MED ORDER — IOHEXOL 350 MG/ML SOLN
75.0000 mL | Freq: Once | INTRAVENOUS | Status: AC | PRN
  Administered 2023-07-03: 100 mL via INTRAVENOUS

## 2023-07-03 NOTE — Progress Notes (Signed)
  These are curbside recommendations based upon the information readily available in the chart on brief review as well as history and examination information provided to me by requesting provider and do not replace a full detailed consult  Margaret Hudson is a 43 y.o. 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  1 week ago she had an episode of difficulty speaking and right hand weakness and numbness lasting approximately an hour.  At this point she stopped Plaquenil and prednisone in case these were contributing to her symptoms.  Subsequently today she had an episode of lightheadedness that sounds presyncopal based on history provided by ED physician  I am asked to comment on concern for TIA.  Certainly at this time her blood pressure is on the low side which is reassuring from a stroke risk perspective but could represent symptomatic adrenal insufficiency secondary to stopping her prednisone.  While the episode 1 week ago is certainly concerning for TIA, most of the benefit of dual antiplatelet therapy is in the first week.  Since she has not had any recurrent neurological symptoms (other than the lightheadedness which is discussed is nonspecific), there is no urgency for immediate inpatient evaluation at this time.  Recommendations: I do think she would benefit from outpatient evaluation including MRI brain without contrast Echocardiogram A1c and lipid panel with goal LDL less than 70, goal A1c less than 7% Given her autoimmune history would also test for antiphospholipids (including lupus anticoagulant panel, cardiolipin antibodies), beta-2 glycoprotein antibodies (ordered), which will take some time to result and should be followed up by her PCP  I would also counsel her on the risk of ongoing hormonal contraceptive use and encourage nonhormonal alternative  Would start aspirin 81 mg  daily  However, if there is significant concern about her ongoing lightheadedness and she is felt to have other indications for inpatient admission as well, I think it would be reasonable to complete the studies while she is admitted.  Ultimately the decision for inpatient workup versus close outpatient follow-up should be made with shared decision making with the patient; per ED provider she is elected for MRI brain to be completed, if negative she will complete the rest of her workup outpatient  16 minutes were spent in care of this patient majority in discussion with Dr. Martina Sledge  CTA head and neck personally reviewed, agree with radiology report No large vessel occlusion. No high-grade stenosis, aneurysm, or dissection of the arteries in the head and neck. Minimal atherosclerosis noted along the carotid siphons. No CT evidence of acute intracranial abnormality. There are numerous bilateral cervical, supraclavicular, and axillary lymph nodes measuring less than 1 cm in short axis. No lymph nodes enlarged by imaging size criteria. Recommend correlation with lab values and clinical follow-up to resolution.   Basic Metabolic Panel: Recent Labs  Lab 07/03/23 0952  NA 141  K 3.6  CL 108  CO2 23  GLUCOSE 104*  BUN 10  CREATININE 0.88  CALCIUM 9.1  MG 1.9    CBC: Recent Labs  Lab 07/03/23 0952  WBC 4.2  NEUTROABS 1.9  HGB 11.3*  HCT 36.2  MCV 78.0*  PLT 267    Coagulation Studies: No results for input(s): "LABPROT", "INR" in the last 72 hours.

## 2023-07-03 NOTE — Discharge Instructions (Addendum)
 Recommend you resume taking your prednisone and plaquenil. Recommend you discontinue use of your oral contraceptive; recommend you follow up with your obgyn/pcp if you would like to continue birth control recommend non-hormonal contraception. Recommend you start daily baby aspirin (81mg ) until you follow up with you pcp.   Your MRI is negative today. You will need to complete an outpatient work up for the remainder of your exam. You have chest pain. You have fast or irregular heartbeats (palpitations). You have any symptoms of a stroke. "BE FAST" is an easy way to remember the main warning signs of a stroke. B - Balance. Signs are dizziness, sudden trouble walking, or loss of balance. E - Eyes. Signs are trouble seeing or a sudden change in vision. F - Face. Signs are sudden weakness or numbness of the face, or the face or eyelid drooping on one side. A - Arms. Signs are weakness or numbness in an arm. This happens suddenly and usually on one side of the body. S - Speech.Signs are sudden trouble speaking, slurred speech, or trouble understanding what people say. T - Time. Time to call emergency services. Write down what time symptoms started. You have other signs of a stroke, such as: A sudden, severe headache with no known cause. Nausea or vomiting. Seizure.

## 2023-07-03 NOTE — ED Notes (Signed)
 Patient transported to MRI

## 2023-07-03 NOTE — ED Notes (Signed)
 AVS provided by edp including prescription changes were discussed with pt. Pt verified pharmacy. Pt verbalized understanding with no additional questions at this time.

## 2023-07-03 NOTE — ED Provider Notes (Signed)
 In short patient here in transfer for TIA like symptoms that happened 1 week ago.  She will need an MRI.  If MRI is negative plan outpatient workup per neurology consult note.    8:55 PM Patient's MRI negative.  She is comfortable with discharge at this time.  I reviewed all findings with the patient.  She will need to follow-up with her primary care physician for further evaluation of TIA symptoms.  Further recommendations and discharge already from neurology.  Appropriate for discharge at this time.   Tama Fails, PA-C 07/03/23 2055    Ninetta Basket, MD 07/04/23 [phone]

## 2023-07-03 NOTE — ED Notes (Signed)
 Pt currently in MRI

## 2023-07-03 NOTE — ED Notes (Signed)
 Explained to RN Jacqlyn Matas that the labs were not able to be drawn in ED at [facility] ED.  Pt. Aware of this and will follow up.  Pt. Aware that the labs were ordered by Neurology.

## 2023-07-03 NOTE — ED Triage Notes (Signed)
 Pt states she started to feel dizzy today at 8 am.  Pt states she had an episode of hand numbness and bilateral arm weakness with some difficulty speaking last week which lasted 30-60 minutes which resolved.  Pt denies headache, denies blurry vision, denies weakness today.  Pt states she has been taking been taking prednisone and plaquenil which was stopped last week.  Denies any pain currently.

## 2023-07-03 NOTE — ED Provider Notes (Signed)
 [place] [facility] Provider Note  CSN: [id number] Arrival date & time: 07/03/23 4098  Chief Complaint(s) Dizziness  HPI Margaret Hudson is a 43 y.o. female with past medical history as below, significant for lupus who presents to the ED with complaint of lightheaded sensation, speech difficulty and weakness 1 week ago  Patient reports around 8 AM this morning she got to her workspace, she felt lightheaded.  Seem to worsen when she closed her eyes.  Denies room spinning sensation.  It is not provoked by exertion.  No chest pain, dyspnea, nausea or vomiting.  No recent falls or head injuries, no similar symptoms in the past.  Sensation has continued here.   She reports 1 week ago while driving she had an episode of difficulty speaking and right hand weakness/numbness that lasted less than 1 hour.  She reports that she was in her car, she noticed her right hand was heavy, felt numb.  She was having difficulty producing words and the words that she was able to speak were not fluent. Lasted <60 mins and resolved, has not re-occurred.  Since then she has stopped taking her Plaquenil and prednisone because she was worried that might have provoked her symptoms.   Past Medical History Past Medical History:  Diagnosis Date  . Lupus (systemic lupus erythematosus) (HCC)   . Vaginal Pap smear, abnormal    Patient Active Problem List   Diagnosis Date Noted  . History of abnormal cervical Pap smear 12/15/2020   Home Medication(s) Prior to Admission medications   Medication Sig Start Date End Date Taking? Authorizing Provider  aspirin 81 MG chewable tablet Chew 1 tablet (81 mg total) by mouth daily. 07/03/23 08/02/23 Yes Teddi Favors, DO  hydroxychloroquine (PLAQUENIL) 200 MG tablet Take 2 tablets by mouth daily.   Yes [provider]  predniSONE (DELTASONE) 10 MG tablet Take 10 mg by mouth daily with breakfast.   Yes [provider]   Cholecalciferol 25 MCG (1000 UT) capsule  03/04/15   [provider]  levonorgestrel -ethinyl estradiol (AVIANE) 0.1-20 MG-MCG tablet Take 1 tablet by mouth once daily 06/18/23   Stinson, Jacob J, DO  Multiple Vitamin (MULTIVITAMIN) tablet Take 1 tablet by mouth daily.    [provider]                                                                                                                                    Past Surgical History Past Surgical History:  Procedure Laterality Date  . BREAST BIOPSY Right 09/28/2021  . BREAST BIOPSY Left 09/28/2021  . BREAST BIOPSY Left 01/01/2022  . galbladder     Family History Family History  Problem Relation Age of Onset  . Diabetes Father   . Hypertension Father   . Hypertension Mother   . Cancer Neg Hx     Social History Social History   Tobacco Use  .  Smoking status: Never  . Smokeless tobacco: Never  Vaping Use  . Vaping status: Never Used  Substance Use Topics  . Alcohol use: Yes    Alcohol/week: 1.0 standard drink of alcohol    Types: 1 Glasses of wine per week  . Drug use: Never   Allergies Patient has no known allergies.  Review of Systems A thorough review of systems was obtained and all systems are negative except as noted in the HPI and PMH.   Physical Exam Vital Signs  I have reviewed the triage vital signs BP 127/77   Pulse 78   Temp 98.7 F (37.1 C) (Oral)   Resp (!) 22   Ht 5\' 5"  (1.651 m)   Wt 113.4 kg   LMP 07/02/2023   SpO2 100%   BMI 41.60 kg/m  Physical Exam Vitals and nursing note reviewed.  Constitutional:      General: She is not in acute distress.    Appearance: Normal appearance. She is obese. She is not ill-appearing.  HENT:     Head: Normocephalic and atraumatic.     Right Ear: External ear normal.     Left Ear: External ear normal.     Nose: Nose normal.     Mouth/Throat:     Mouth: Mucous membranes are moist.  Eyes:     General: No visual field deficit or scleral  icterus.       Right eye: No discharge.        Left eye: No discharge.     Extraocular Movements: Extraocular movements intact.     Conjunctiva/sclera: Conjunctivae normal.     Pupils: Pupils are equal, round, and reactive to light.  Neck:     Vascular: No carotid bruit.     Trachea: Trachea and phonation normal.  Cardiovascular:     Rate and Rhythm: Normal rate and regular rhythm.     Pulses: Normal pulses.     Heart sounds: Normal heart sounds.  Pulmonary:     Effort: Pulmonary effort is normal. No respiratory distress.     Breath sounds: Normal breath sounds. No stridor.  Abdominal:     General: Abdomen is flat. There is no distension.     Palpations: Abdomen is soft.     Tenderness: There is no abdominal tenderness.  Musculoskeletal:     Cervical back: Full passive range of motion without pain. No rigidity.     Right lower leg: No edema.     Left lower leg: No edema.  Skin:    General: Skin is warm and dry.     Capillary Refill: Capillary refill takes less than 2 seconds.  Neurological:     Mental Status: She is alert and oriented to person, place, and time.     GCS: GCS eye subscore is 4. GCS verbal subscore is 5. GCS motor subscore is 6.     Cranial Nerves: Cranial nerves 2-12 are intact. No dysarthria or facial asymmetry.     Sensory: Sensation is intact. No sensory deficit.     Motor: Motor function is intact. No tremor or pronator drift.     Coordination: Coordination is intact. Coordination normal. Finger-Nose-Finger Test normal.     Gait: Gait is intact.     Comments: Strength 5/5 to BLUE/BLLE, equal and symmetric  Speech is fluent     Psychiatric:        Mood and Affect: Mood normal.        Behavior: Behavior normal. Behavior is cooperative.  ED Results and Treatments Labs (all labs ordered are listed, but only abnormal results are displayed) Labs Reviewed  CBC WITH DIFFERENTIAL/PLATELET - Abnormal; Notable for the following components:      Result  Value   Hemoglobin 11.3 (*)    MCV 78.0 (*)    MCH 24.4 (*)    All other components within normal limits  COMPREHENSIVE METABOLIC PANEL WITH GFR - Abnormal; Notable for the following components:   Glucose, Bld 104 (*)    All other components within normal limits  URINALYSIS, ROUTINE W REFLEX MICROSCOPIC - Abnormal; Notable for the following components:   Hgb urine dipstick LARGE (*)    Leukocytes,Ua SMALL (*)    All other components within normal limits  URINALYSIS, MICROSCOPIC (REFLEX) - Abnormal; Notable for the following components:   Bacteria, UA RARE (*)    All other components within normal limits  MAGNESIUM  PREGNANCY, URINE  TSH  T4, FREE  BETA-2-GLYCOPROTEIN I ABS, IGG/M/A  ANTIPHOSPHOLIPID SYNDROME EVAL, BLD  HEMOGLOBIN A1C  TROPONIN T, HIGH SENSITIVITY                                                                                                                          Radiology CT ANGIO HEAD NECK W WO CM Result Date: 07/03/2023 CLINICAL DATA:  Transient ischemic attack, aphasia, right hand weakness 1 week ago. Dizziness this morning. EXAM: CT ANGIOGRAPHY HEAD AND NECK WITH AND WITHOUT CONTRAST TECHNIQUE: Multidetector CT imaging of the head and neck was performed using the standard protocol during bolus administration of intravenous contrast. Multiplanar CT image reconstructions and MIPs were obtained to evaluate the vascular anatomy. Carotid stenosis measurements (when applicable) are obtained utilizing NASCET criteria, using the distal internal carotid diameter as the denominator. RADIATION DOSE REDUCTION: This exam was performed according to the departmental dose-optimization program which includes automated exposure control, adjustment of the mA and/or kV according to patient size and/or use of iterative reconstruction technique. CONTRAST:  100mL OMNIPAQUE IOHEXOL 350 MG/ML SOLN COMPARISON:  None Available. FINDINGS: CT HEAD FINDINGS Brain: No acute intracranial  hemorrhage. No CT evidence of acute infarct. No edema, mass effect, or midline shift. The basilar cisterns are patent. Ventricles: Ventricles are normal in size and configuration. Vascular: No hyperdense vessel. Skull: No acute or aggressive finding. Sinuses/orbits: The visualized paranasal sinuses are clear. Orbits are symmetric. Other: Mastoid air cells are clear. CTA NECK FINDINGS Aortic arch: Standard configuration of the aortic arch. Imaged portion shows no evidence of aneurysm or dissection. No significant stenosis of the major arch vessel origins. Pulmonary arteries: As permitted by contrast timing, there are no filling defects in the visualized pulmonary arteries. Subclavian arteries: The subclavian arteries are patent bilaterally. Right carotid system: No evidence of dissection, stenosis (50% or greater), or occlusion. Left carotid system: No evidence of dissection, stenosis (50% or greater), or occlusion. Vertebral arteries: Left vertebral artery is dominant. No evidence of dissection, stenosis (50% or greater), or occlusion. Skeleton: No acute or  aggressive finding noted. Other neck: The airway is patent. There are numerous bilateral cervical lymph nodes all of which appear to measure less than 1 cm in short axis along with additional subcentimeter supraclavicular and axillary lymph nodes. No enlarged lymph nodes by imaging size criteria. Upper chest: Visualized lung apices are clear. Review of the MIP images confirms the above findings CTA HEAD FINDINGS ANTERIOR CIRCULATION: The intracranial ICAs are patent bilaterally. Minimal atherosclerosis of the carotid siphons without stenosis. No significant stenosis, proximal occlusion, aneurysm, or vascular malformation. MCAs: The middle cerebral arteries are patent bilaterally. ACAs: Patent bilaterally. 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 POSTERIOR CIRCULATION: No significant stenosis, proximal occlusion, aneurysm, or  vascular malformation. PCAs: The posterior cerebral arteries are patent bilaterally. Pcomm: The posterior communicating arteries are visualized bilaterally. SCAs: The superior cerebellar arteries are patent bilaterally. Basilar artery: Patent AICAs: Patent PICAs: Patent Vertebral arteries: The intracranial vertebral arteries are patent. Venous sinuses: As permitted by contrast timing, patent. Anatomic variants: None Review of the MIP images confirms the above findings IMPRESSION: No large vessel occlusion. No high-grade stenosis, aneurysm, or dissection of the arteries in the head and neck. Minimal atherosclerosis noted along the carotid siphons. No CT evidence of acute intracranial abnormality. There are numerous bilateral cervical, supraclavicular, and axillary lymph nodes measuring less than 1 cm in short axis. No lymph nodes enlarged by imaging size criteria. Recommend correlation with lab values and clinical follow-up to resolution. Electronically Signed   By: Denny Flack M.D.   On: 07/03/2023 12:34   DG Chest Portable 1 View Result Date: 07/03/2023 CLINICAL DATA:  Lightheaded EXAM: PORTABLE CHEST 1 VIEW COMPARISON:  None Available. FINDINGS: The heart size and mediastinal contours are within normal limits. Both lungs are clear. The visualized skeletal structures are unremarkable. IMPRESSION: No active disease. Electronically Signed   By: Fredrich Jefferson M.D.   On: 07/03/2023 10:23    Pertinent labs & imaging results that were available during my care of the patient were reviewed by me and considered in my medical decision making (see MDM for details).  Medications Ordered in ED Medications  sodium chloride 0.9 % bolus 1,000 mL (0 mLs Intravenous Stopped 07/03/23 1315)  iohexol (OMNIPAQUE) 350 MG/ML injection 75 mL (100 mLs Intravenous Contrast Given 07/03/23 1102)                                                                                                                                      Procedures .Critical Care  Performed by: Teddi Favors, DO Authorized by: Teddi Favors, DO   Critical care provider statement:    Critical care time (minutes):  30   Critical care time was exclusive of:  Separately billable procedures and treating other patients   Critical care was necessary to treat or prevent imminent or life-threatening deterioration of the following conditions:  CNS failure or compromise  Critical care was time spent personally by me on the following activities:  Development of treatment plan with patient or surrogate, discussions with consultants, evaluation of patient's response to treatment, examination of patient, ordering and review of laboratory studies, ordering and review of radiographic studies, ordering and performing treatments and interventions, pulse oximetry, re-evaluation of patient's condition, review of old charts and obtaining history from patient or surrogate   Care discussed with: accepting provider at another facility     Care discussed with comment:  Consultant   (including critical care time)  Medical Decision Making / ED Course    Medical Decision Making:    Breella Vanostrand is a 43 y.o. female with past medical history as below, significant for lupus who presents to the ED with complaint of lightheaded sensation, speech difficulty and weakness 1 week ago. The complaint involves an extensive differential diagnosis and also carries with it a high risk of complications and morbidity.  Serious etiology was considered. Ddx includes but is not limited to: TIA, CVA, metabolic derangement, infection, infarction, medication effect, dehydration, psychosomatic,  Complete initial physical exam performed, notably the patient was in no acute distress, sitting comfortably on stretcher.    Reviewed and confirmed nursing documentation for past medical history, family history, social history.  Vital signs reviewed.     Brief  summary:  43 year old female history as above here with lightheadedness, transient right arm/hand weakness/numbness w/ possible aphasia Neuroexam is nonfocal, she is HDS   Clinical Course as of 07/03/23 1444  Thu Jul 03, 2023  1259 Asa 81mg  until o/p eval complete [SG]  1301 Restart pred/plaquenil  [SG]  1303 Spoke w/ neurology Dr Cleone Dad. Added some further labs, see her note [SG]    Clinical Course User Index [SG] Teddi Favors, DO   Workup here is stable  Her symptoms have improved after fluids. She recently stopped her chronic prednisone which could be contributing to her light headedness sensation. Advised to resume this along with her plaquenil.   History is concerning for possible TIA, she is 1wk from the incident. Discussed w/ neurology, see note. Discussed options with the patient, shared decision making was utilized, she would prefer to have MRI completed today and then will follow up with her PCP if negative.   Advised to resume plaquenil/prednisone, start ASA 81mg  every day, stop OCP  Will send to [facility] ED to ED for MRI as this is not available here.  Dr Synetta Eves accepting              Additional history obtained: -Additional history obtained from na -External records from outside source obtained and reviewed including: Chart review including previous notes, labs, imaging, consultation notes including  Home medications Primary care documentation   Lab Tests: -I ordered, reviewed, and interpreted labs.   The pertinent results include:   Labs Reviewed  CBC WITH DIFFERENTIAL/PLATELET - Abnormal; Notable for the following components:      Result Value   Hemoglobin 11.3 (*)    MCV 78.0 (*)    MCH 24.4 (*)    All other components within normal limits  COMPREHENSIVE METABOLIC PANEL WITH GFR - Abnormal; Notable for the following components:   Glucose, Bld 104 (*)    All other components within normal limits  URINALYSIS, ROUTINE W REFLEX MICROSCOPIC - Abnormal;  Notable for the following components:   Hgb urine dipstick LARGE (*)    Leukocytes,Ua SMALL (*)    All other components within normal limits  URINALYSIS, MICROSCOPIC (  REFLEX) - Abnormal; Notable for the following components:   Bacteria, UA RARE (*)    All other components within normal limits  MAGNESIUM  PREGNANCY, URINE  TSH  T4, FREE  BETA-2-GLYCOPROTEIN I ABS, IGG/M/A  ANTIPHOSPHOLIPID SYNDROME EVAL, BLD  HEMOGLOBIN A1C  TROPONIN T, HIGH SENSITIVITY    Notable for labs stable  EKG   EKG Interpretation Date/Time:  Thursday July 03 2023 09:24:35 EDT Ventricular Rate:  97 PR Interval:  140 QRS Duration:  91 QT Interval:  357 QTC Calculation: 454 R Axis:   27  Text Interpretation: Sinus rhythm Posterior infarct, old no prior no stemi Confirmed by Russella Courts ([id number]) on 07/03/2023 9:28:00 AM         Imaging Studies ordered: I ordered imaging studies including CTA head neck, cxr I independently visualized the following imaging with scope of interpretation limited to determining acute life threatening conditions related to emergency care; findings noted above I agree with the radiologist interpretation If any imaging was obtained with contrast I closely monitored patient for any possible adverse reaction a/w contrast administration in the emergency department   Medicines ordered and prescription drug management: Meds ordered this encounter  Medications  . sodium chloride 0.9 % bolus 1,000 mL  . iohexol (OMNIPAQUE) 350 MG/ML injection 75 mL  . aspirin 81 MG chewable tablet    Sig: Chew 1 tablet (81 mg total) by mouth daily.    Dispense:  30 tablet    Refill:  0    -I have reviewed the patients home medicines and have made adjustments as needed   Consultations Obtained: I requested consultation with the neurology,  and discussed lab and imaging findings as well as pertinent plan - they recommend: see note   Cardiac Monitoring: The patient was maintained on a cardiac  monitor.  I personally viewed and interpreted the cardiac monitored which showed an underlying rhythm of: nsr Continuous pulse oximetry interpreted by myself, 100% on ra.    Social Determinants of Health:  Diagnosis or treatment significantly limited by social determinants of health: obesity   Reevaluation: After the interventions noted above, I reevaluated the patient and found that they have improved  Co morbidities that complicate the patient evaluation . Past Medical History:  Diagnosis Date  . Lupus (systemic lupus erythematosus) (HCC)   . Vaginal Pap smear, abnormal       Dispostion: Disposition decision including need for hospitalization was considered, and patient transferred.    Final Clinical Impression(s) / ED Diagnoses Final diagnoses:  Aphasia  Light-headed feeling  TIA (transient ischemic attack)        Teddi Favors, DO 07/03/23 1345

## 2023-08-18 ENCOUNTER — Ambulatory Visit: Payer: Self-pay | Admitting: Family Medicine

## 2023-08-22 DIAGNOSIS — L930 Discoid lupus erythematosus: Secondary | ICD-10-CM | POA: Diagnosis not present

## 2023-08-22 DIAGNOSIS — R42 Dizziness and giddiness: Secondary | ICD-10-CM | POA: Diagnosis not present

## 2023-08-22 DIAGNOSIS — M329 Systemic lupus erythematosus, unspecified: Secondary | ICD-10-CM | POA: Diagnosis not present

## 2023-08-22 DIAGNOSIS — M2550 Pain in unspecified joint: Secondary | ICD-10-CM | POA: Diagnosis not present

## 2023-08-26 ENCOUNTER — Encounter (INDEPENDENT_AMBULATORY_CARE_PROVIDER_SITE_OTHER): Payer: Self-pay

## 2023-09-02 DIAGNOSIS — M329 Systemic lupus erythematosus, unspecified: Secondary | ICD-10-CM | POA: Diagnosis not present

## 2023-09-07 ENCOUNTER — Other Ambulatory Visit: Payer: Self-pay | Admitting: Medical Genetics

## 2023-09-10 ENCOUNTER — Encounter: Payer: Self-pay | Admitting: Nurse Practitioner

## 2023-09-10 ENCOUNTER — Ambulatory Visit: Admitting: Nurse Practitioner

## 2023-09-10 VITALS — BP 115/72 | HR 79 | Temp 98.8°F | Wt 258.0 lb

## 2023-09-10 DIAGNOSIS — M329 Systemic lupus erythematosus, unspecified: Secondary | ICD-10-CM | POA: Diagnosis not present

## 2023-09-10 DIAGNOSIS — D649 Anemia, unspecified: Secondary | ICD-10-CM | POA: Insufficient documentation

## 2023-09-10 DIAGNOSIS — R42 Dizziness and giddiness: Secondary | ICD-10-CM | POA: Diagnosis not present

## 2023-09-10 DIAGNOSIS — G459 Transient cerebral ischemic attack, unspecified: Secondary | ICD-10-CM | POA: Insufficient documentation

## 2023-09-10 DIAGNOSIS — Z1211 Encounter for screening for malignant neoplasm of colon: Secondary | ICD-10-CM

## 2023-09-10 DIAGNOSIS — H6121 Impacted cerumen, right ear: Secondary | ICD-10-CM | POA: Insufficient documentation

## 2023-09-10 DIAGNOSIS — Z8673 Personal history of transient ischemic attack (TIA), and cerebral infarction without residual deficits: Secondary | ICD-10-CM | POA: Insufficient documentation

## 2023-09-10 MED ORDER — MECLIZINE HCL 12.5 MG PO TABS: 12.5000 mg | ORAL_TABLET | Freq: Three times a day (TID) | ORAL | 0 refills | Status: AC | PRN

## 2023-09-10 NOTE — Assessment & Plan Note (Signed)
 Systemic lupus erythematosus Managed with Plaquenil 400 mg daily and prednisone 10 mg daily under rheumatologist care.

## 2023-09-10 NOTE — Patient Instructions (Addendum)
   1. Screening for colon cancer (Primary)  - Ambulatory referral to Gastroenterology  2. Anemia, unspecified type  - CBC; Future - Iron, TIBC and Ferritin Panel; Future - Vitamin B12; Future  3. Dizziness  - meclizine  (ANTIVERT ) 12.5 MG tablet; Take 1 tablet (12.5 mg total) by mouth 3 (three) times daily as needed for dizziness.  Dispense: 30 tablet; Refill: 0    It is important that you exercise regularly at least 30 minutes 5 times a week as tolerated  Think about what you will eat, plan ahead. Choose  clean, green, fresh or frozen over canned, processed or packaged foods which are more sugary, salty and fatty. 70 to 75% of food eaten should be vegetables and fruit. Three meals at set times with snacks allowed between meals, but they must be fruit or vegetables. Aim to eat over a 12 hour period , example 7 am to 7 pm, and STOP after  your last meal of the day. Drink water,generally about 64 ounces per day, no other drink is as healthy. Fruit juice is best enjoyed in a healthy way, by EATING the fruit.  Thanks for choosing [facility] we consider it a privelige to serve you.

## 2023-09-10 NOTE — Assessment & Plan Note (Signed)
Recommend Debrox for ear wax removal.

## 2023-09-10 NOTE — Assessment & Plan Note (Signed)
 Dizziness and suspected vertigo Dizziness for one month, possibly due to vertigo or anemia. ENT referral scheduled. - Order CBC and iron studies. - Prescribe meclizine  12.5 mg 3 times daily PRN.

## 2023-09-10 NOTE — Progress Notes (Addendum)
 New Patient Office Visit  Subjective:  Patient ID: Margaret Hudson, female    DOB: 1980-06-08  Age: 43 y.o. MRN: [id number]  CC:  Chief Complaint  Patient presents with   Establish Care     Discussed the use of AI scribe software for clinical note transcription with the patient, who gave verbal consent to proceed.  History of Present Illness Margaret Hudson is a 43 year old female with lupus who presents to establish care.  She has been experiencing dizziness for the past month, described as lightheadedness  There was one instance where she was unable to get out of bed due to the dizziness, but now it occurs randomly. No chest pain, shoulder pain, or tinnitus. Despite a history of anemia and low hemoglobin levels noted two months ago, she has not been started on iron  supplements.   She is currently taking Plaquenil 400 mg daily and prednisone 10 mg daily, which have been part of her regimen for about a year for lupus, under the care of a rheumatologist at [facility].  She is single, lives alone, and drinks alcohol occasionally, typically on holidays. She does not smoke or use drugs.    Past Medical History:  Diagnosis Date   Anemia 07/2023   Unsure   Arthritis 2025   Glaucoma    I have a test scheduled for 9/25   Lupus (systemic lupus erythematosus) (HCC)    Stroke (HCC) 6 /2025   Mini stroke   Vaginal Pap smear, abnormal     Past Surgical History:  Procedure Laterality Date   BREAST BIOPSY Right 09/28/2021   BREAST BIOPSY Left 09/28/2021   BREAST BIOPSY Left 01/01/2022   CHOLECYSTECTOMY  01/2022   galbladder      Family History  Problem Relation Age of Onset   Diabetes Father    Hypertension Father    Alcohol abuse Father    Arthritis Father    Drug abuse Father    Obesity Father    Hypertension Mother    Arthritis Mother    Asthma Mother    Obesity Mother    Arthritis Sister    Obesity Sister    Obesity Sister    Cancer  Neg Hx     Social History   Socioeconomic History   Marital status: Single    Spouse name: Not on file   Number of children: Not on file   Years of education: Not on file   Highest education level: Master's degree (e.g., MA, MS, MEng, MEd, MSW, MBA)  Occupational History   Not on file  Tobacco Use   Smoking status: Never   Smokeless tobacco: Never  Vaping Use   Vaping status: Never Used  Substance and Sexual Activity   Alcohol use: Yes    Alcohol/week: 1.0 standard drink of alcohol    Types: 1 Glasses of wine per week   Drug use: Never   Sexual activity: Not Currently    Birth control/protection: Abstinence, Condom  Other Topics Concern   Not on file  Social History Narrative   Lives home alone    Social Drivers of Health   Financial Resource Strain: Low Risk  (09/06/2023)   Overall Financial Resource Strain (CARDIA)    Difficulty of Paying Living Expenses: Not hard at all  Food Insecurity: No Food Insecurity (09/06/2023)   Hunger Vital Sign    Worried About Running Out of Food in the Last Year: Never true    Ran Out  of Food in the Last Year: Never true  Transportation Needs: No Transportation Needs (09/06/2023)   PRAPARE - Administrator, Civil Service (Medical): No    Lack of Transportation (Non-Medical): No  Physical Activity: Insufficiently Active (09/06/2023)   Exercise Vital Sign    Days of Exercise per Week: 3 days    Minutes of Exercise per Session: 30 min  Stress: No Stress Concern Present (09/06/2023)   [employer] of Occupational Health - Occupational Stress Questionnaire    Feeling of Stress: Only a little  Social Connections: Moderately Isolated (09/06/2023)   Social Connection and Isolation Panel    Frequency of Communication with Friends and Family: More than three times a week    Frequency of Social Gatherings with Friends and Family: Once a week    Attends Religious Services: More than 4 times per year    Active Member of [employer] or  Organizations: No    Attends Engineer, structural: Not on file    Marital Status: Never married  Intimate Partner Violence: Not on file    ROS Review of Systems  Constitutional:  Negative for appetite change, chills, fatigue and fever.  HENT:  Negative for congestion, postnasal drip, rhinorrhea and sneezing.   Respiratory:  Negative for cough, shortness of breath and wheezing.   Cardiovascular:  Negative for chest pain, palpitations and leg swelling.  Gastrointestinal:  Negative for abdominal pain, constipation, nausea and vomiting.  Genitourinary:  Negative for difficulty urinating, dysuria, flank pain and frequency.  Musculoskeletal:  Negative for arthralgias, back pain, joint swelling and myalgias.  Skin:  Negative for color change, pallor, rash and wound.  Neurological:  Positive for dizziness. Negative for facial asymmetry, weakness, numbness and headaches.  Psychiatric/Behavioral:  Negative for behavioral problems, confusion, self-injury and suicidal ideas.     Objective:   Today's Vitals: BP 115/72   Pulse 79   Temp 98.8 F (37.1 C) (Oral)   Wt 258 lb (117 kg)   SpO2 98%   BMI 42.93 kg/m   Physical Exam Vitals and nursing note reviewed.  Constitutional:      General: She is not in acute distress.    Appearance: Normal appearance. She is obese. She is not ill-appearing, toxic-appearing or diaphoretic.  HENT:     Right Ear: Tympanic membrane, ear canal and external ear normal. There is impacted cerumen.     Left Ear: Tympanic membrane, ear canal and external ear normal. There is no impacted cerumen.  Eyes:     General: No scleral icterus.       Right eye: No discharge.        Left eye: No discharge.     Extraocular Movements: Extraocular movements intact.     Conjunctiva/sclera: Conjunctivae normal.  Cardiovascular:     Rate and Rhythm: Normal rate and regular rhythm.     Pulses: Normal pulses.     Heart sounds: Normal heart sounds. No murmur heard.     No friction rub. No gallop.  Pulmonary:     Effort: Pulmonary effort is normal. No respiratory distress.     Breath sounds: Normal breath sounds. No stridor. No wheezing, rhonchi or rales.  Chest:     Chest wall: No tenderness.  Abdominal:     General: There is no distension.     Palpations: Abdomen is soft.     Tenderness: There is no abdominal tenderness. There is no right CVA tenderness, left CVA tenderness or guarding.  Musculoskeletal:  General: No swelling, tenderness, deformity or signs of injury.     Right lower leg: No edema.     Left lower leg: No edema.  Skin:    General: Skin is warm and dry.     Capillary Refill: Capillary refill takes less than 2 seconds.     Coloration: Skin is not jaundiced or pale.     Findings: Rash present. No bruising, erythema or lesion.     Comments: Lupus rash noted on the face  Neurological:     Mental Status: She is alert and oriented to person, place, and time.     Motor: No weakness.     Coordination: Coordination normal.     Gait: Gait normal.  Psychiatric:        Mood and Affect: Mood normal.        Behavior: Behavior normal.        Thought Content: Thought content normal.        Judgment: Judgment normal.     Assessment & Plan:  Assessment and Plan Assessment & Plan        Problem List Items Addressed This Visit       Cardiovascular and Mediastinum   TIA (transient ischemic attack)   Had TIA in June 2025 We will check lipid panel at next visit Blood pressure is currently well-controlled        Nervous and Auditory   Impacted cerumen, right ear   - Recommend Debrox for ear wax removal.        Other   Lupus (systemic lupus erythematosus) (HCC)   Systemic lupus erythematosus Managed with Plaquenil 400 mg daily and prednisone 10 mg daily under rheumatologist care.      Dizziness - Primary   Dizziness and suspected vertigo Dizziness for one month, possibly due to vertigo or anemia. ENT referral  scheduled. - Order CBC and iron  studies. - Prescribe meclizine  12.5 mg 3 times daily PRN.       Relevant Medications   meclizine  (ANTIVERT ) 12.5 MG tablet   Anemia   Anemia Previous low hemoglobin levels, potential contributor to dizziness. - Order CBC and iron  studies.      Relevant Orders   CBC   Iron , TIBC and Ferritin Panel   Vitamin B12   Other Visit Diagnoses       Screening for colon cancer       Relevant Orders   Ambulatory referral to Gastroenterology       Outpatient Encounter Medications as of 09/10/2023  Medication Sig   Cholecalciferol 25 MCG (1000 UT) capsule Take 1,000 Units by mouth daily.   hydroxychloroquine (PLAQUENIL) 200 MG tablet Take 2 tablets by mouth daily.   meclizine  (ANTIVERT ) 12.5 MG tablet Take 1 tablet (12.5 mg total) by mouth 3 (three) times daily as needed for dizziness.   Multiple Vitamin (MULTIVITAMIN) tablet Take 1 tablet by mouth daily.   predniSONE (DELTASONE) 10 MG tablet Take 10 mg by mouth daily with breakfast.   levonorgestrel -ethinyl estradiol (AVIANE) 0.1-20 MG-MCG tablet Take 1 tablet by mouth once daily (Patient not taking: Reported on 07/03/2023)   No facility-administered encounter medications on file as of 09/10/2023.    Follow-up: Return in about 3 months (around 12/11/2023) for CPE.   Pablo Stauffer R Avalon Coppinger, FNP

## 2023-09-10 NOTE — Assessment & Plan Note (Signed)
 Anemia Previous low hemoglobin levels, potential contributor to dizziness. - Order CBC and iron  studies.

## 2023-09-10 NOTE — Assessment & Plan Note (Signed)
 Had TIA in June 2025 We will check lipid panel at next visit Blood pressure is currently well-controlled

## 2023-09-16 ENCOUNTER — Other Ambulatory Visit: Payer: Self-pay

## 2023-09-17 ENCOUNTER — Other Ambulatory Visit: Payer: Self-pay | Admitting: Nurse Practitioner

## 2023-09-17 DIAGNOSIS — Z1231 Encounter for screening mammogram for malignant neoplasm of breast: Secondary | ICD-10-CM

## 2023-09-22 ENCOUNTER — Other Ambulatory Visit

## 2023-09-22 DIAGNOSIS — D649 Anemia, unspecified: Secondary | ICD-10-CM | POA: Diagnosis not present

## 2023-09-23 ENCOUNTER — Ambulatory Visit: Payer: Self-pay | Admitting: Nurse Practitioner

## 2023-09-23 DIAGNOSIS — R718 Other abnormality of red blood cells: Secondary | ICD-10-CM

## 2023-09-23 LAB — CBC
Hematocrit: 42.1 % (ref 34.0–46.6)
Hemoglobin: 12.4 g/dL (ref 11.1–15.9)
MCH: 24.1 pg — ABNORMAL LOW (ref 26.6–33.0)
MCHC: 29.5 g/dL — ABNORMAL LOW (ref 31.5–35.7)
MCV: 82 fL (ref 79–97)
Platelets: 292 x10E3/uL (ref 150–450)
RBC: 5.14 x10E6/uL (ref 3.77–5.28)
RDW: 14.1 % (ref 11.7–15.4)
WBC: 7 x10E3/uL (ref 3.4–10.8)

## 2023-09-23 LAB — IRON,TIBC AND FERRITIN PANEL
Ferritin: 46 ng/mL (ref 15–150)
Iron Saturation: 14 % — ABNORMAL LOW (ref 15–55)
Iron: 37 ug/dL (ref 27–159)
Total Iron Binding Capacity: 267 ug/dL (ref 250–450)
UIBC: 230 ug/dL (ref 131–425)

## 2023-09-23 LAB — VITAMIN B12: Vitamin B-12: 549 pg/mL (ref 232–1245)

## 2023-09-23 MED ORDER — IRON (FERROUS SULFATE) 325 (65 FE) MG PO TABS: 325.0000 mg | ORAL_TABLET | Freq: Every day | ORAL | 2 refills | Status: DC

## 2023-09-30 DIAGNOSIS — M329 Systemic lupus erythematosus, unspecified: Secondary | ICD-10-CM | POA: Diagnosis not present

## 2023-10-03 ENCOUNTER — Ambulatory Visit
Admission: RE | Admit: 2023-10-03 | Discharge: 2023-10-03 | Disposition: A | Discharge status: Home / Self Care | Source: Ambulatory Visit

## 2023-10-03 DIAGNOSIS — Z1231 Encounter for screening mammogram for malignant neoplasm of breast: Secondary | ICD-10-CM

## 2023-10-08 ENCOUNTER — Ambulatory Visit: Payer: Self-pay | Admitting: Nurse Practitioner

## 2023-10-21 DIAGNOSIS — H40013 Open angle with borderline findings, low risk, bilateral: Secondary | ICD-10-CM | POA: Diagnosis not present

## 2023-10-24 ENCOUNTER — Ambulatory Visit: Payer: Self-pay | Admitting: Nurse Practitioner

## 2023-10-28 DIAGNOSIS — M329 Systemic lupus erythematosus, unspecified: Secondary | ICD-10-CM | POA: Diagnosis not present

## 2023-11-10 ENCOUNTER — Ambulatory Visit (INDEPENDENT_AMBULATORY_CARE_PROVIDER_SITE_OTHER): Admitting: Audiology

## 2023-11-10 ENCOUNTER — Encounter (INDEPENDENT_AMBULATORY_CARE_PROVIDER_SITE_OTHER): Payer: Self-pay | Admitting: Physician Assistant

## 2023-11-10 ENCOUNTER — Ambulatory Visit (INDEPENDENT_AMBULATORY_CARE_PROVIDER_SITE_OTHER): Admitting: Physician Assistant

## 2023-11-10 VITALS — BP 107/78 | HR 85 | Temp 98.4°F | Ht 65.0 in | Wt 250.0 lb

## 2023-11-10 DIAGNOSIS — Z8669 Personal history of other diseases of the nervous system and sense organs: Secondary | ICD-10-CM

## 2023-11-10 DIAGNOSIS — Z09 Encounter for follow-up examination after completed treatment for conditions other than malignant neoplasm: Secondary | ICD-10-CM | POA: Diagnosis not present

## 2023-11-10 DIAGNOSIS — Z0110 Encounter for examination of ears and hearing without abnormal findings: Secondary | ICD-10-CM

## 2023-11-10 DIAGNOSIS — R42 Dizziness and giddiness: Secondary | ICD-10-CM

## 2023-11-10 NOTE — Progress Notes (Signed)
  [address] [place], [place] [address] [phone]  Audiological Evaluation    Name: Margaret Hudson     DOB:   [birth year]      MRN:   [id number]                                                                                     Service Date: 11/10/2023     Accompanied by: unaccompanied   Patient comes today after Reyes Cohen, PA-C sent a referral for a hearing evaluation due to concerns with dizziness.   Symptoms Yes Details  Hearing loss  [x]  Perceived some right hearing loss while she had vertigo.  Tinnitus  []     Ear pain/ infections/pressure  [x]  Perceived some ear fullness , worse in the right ear while she had vertigo.  Balance problems  [x]  Reports spinning with nausea for a day and then lightheaded for a week.  Noise exposure history  []    Previous ear surgeries  []    Family history of hearing loss  [x]  Father (not diagnosed), with age  Amplification  []    Other  []      Otoscopy: Right ear: Clear external ear canal and notable landmarks visualized on the tympanic membrane. Left ear:  Clear external ear canal and notable landmarks visualized on the tympanic membrane.  Tympanometry: Right ear: Type A- Normal external ear canal volume with normal middle ear pressure and tympanic membrane compliance. Left ear: Type A- Normal external ear canal volume with normal middle ear pressure and tympanic membrane compliance.    Pure tone Audiometry: Right ear- Normal hearing from 125 Hz - 8000 Hz. Left ear-  Normal hearing from 125 Hz - 8000 Hz.  Speech Audiometry: Right ear- Speech Reception Threshold (SRT) was obtained at 5 dBHL. Left ear-Speech Reception Threshold (SRT) was obtained at 5 dBHL.   Word Recognition Score Tested using NU-6 (recorded) Right ear: 100% was obtained at a presentation level of 50 dBHL with contralateral masking which is deemed as  excellent. Left ear: 100% was obtained at a presentation level of 50 dBHL with  contralateral masking which is deemed as  excellent.   The hearing test results were completed under headphones and results are deemed to be of good reliability. Test technique:  conventional    Impression: There is not a significant difference in pure-tone thresholds between ears. There is not a significant difference in the word recognition score in between ears.    Recommendations: Follow up with ENT as scheduled for today. Return for a hearing evaluation if concerns with hearing changes arise or per MD recommendation.   Maleke Feria MARIE LEROUX-MARTINEZ, AUD

## 2023-11-10 NOTE — Progress Notes (Signed)
 Dear Dr. Mikel, Here is my assessment for our mutual patient, Margaret Hudson. Thank you for allowing me the opportunity to care for your patient. Please do not hesitate to contact me should you have any other questions. Sincerely, Chyrl Cohen PA-C  Otolaryngology Clinic Note Referring provider: Dr. Mikel HPI:  Margaret Hudson is a 43 y.o. female kindly referred by Dr. Mikel   The patient is a 43 year old female seen in our office for evaluation and dizziness.  The patient notes that in June she had episodes of slurred speech and some other neurologic complaints.  She notes approximately 1 week later she developed some dizziness, she felt like the room was spinning, felt she could not walk.  She denied any associated neurologic symptoms at this time.  She did note associated fullness in the ears with some muffled hearing bilateral, no hearing loss, no ringing.  No associated URI symptoms or headache.  She notes the symptoms completely resolved after 1 week.  She notes again approximately a week later she had a slightly different sensation of feeling lightheaded, no significant room spinning again no other neurologic complaints but did have associated fullness in the ears, again this lasted approximately a week and went away on its own.  This was approximately 3 to 4 weeks ago.  Since that time she has been feeling well with no recurrence of dizziness, no neurologic symptoms.  She has known history of seasonal allergies that are very well-controlled with Zyrtec as needed.  She seen by her primary care provider and was started on iron  pills for low iron .  She denies any preceding history of the same.  No history repeat ear infections, no history of migraines, no history of vertigo previously.  She did have a CT angio head/neck and MRI brain for concern for TIA which were normal.   Independent Review of Additional Tests or Records:  ER visit on 07/03/2023  Audiological evaluation  11/10/2023-normal audiological evaluation   PMH/Meds/All/SocHx/FamHx/ROS:   Past Medical History:  Diagnosis Date   Anemia 07/2023   Unsure   Arthritis 2025   Glaucoma    I have a test scheduled for 9/25   Lupus (systemic lupus erythematosus) (HCC)    Stroke (HCC) 6 /2025   Mini stroke   Vaginal Pap smear, abnormal      Past Surgical History:  Procedure Laterality Date   BREAST BIOPSY Right 09/28/2021   BREAST BIOPSY Left 09/28/2021   BREAST BIOPSY Left 01/01/2022   CHOLECYSTECTOMY  01/2022   galbladder      Family History  Problem Relation Age of Onset   Diabetes Father    Hypertension Father    Alcohol abuse Father    Arthritis Father    Drug abuse Father    Obesity Father    Hypertension Mother    Arthritis Mother    Asthma Mother    Obesity Mother    Arthritis Sister    Obesity Sister    Obesity Sister    Cancer Neg Hx      Social Connections: Moderately Isolated (09/06/2023)   Social Connection and Isolation Panel    Frequency of Communication with Friends and Family: More than three times a week    Frequency of Social Gatherings with Friends and Family: Once a week    Attends Religious Services: More than 4 times per year    Active Member of [employer] or Organizations: No    Attends Banker Meetings: Not on file  Marital Status: Never married      Current Outpatient Medications:    Cholecalciferol 25 MCG (1000 UT) capsule, Take 1,000 Units by mouth daily., Disp: , Rfl:    hydroxychloroquine (PLAQUENIL) 200 MG tablet, Take 2 tablets by mouth daily., Disp: , Rfl:    Iron , Ferrous Sulfate , 325 (65 Fe) MG TABS, Take 325 mg by mouth daily., Disp: 30 tablet, Rfl: 2   meclizine  (ANTIVERT ) 12.5 MG tablet, Take 1 tablet (12.5 mg total) by mouth 3 (three) times daily as needed for dizziness., Disp: 30 tablet, Rfl: 0   Multiple Vitamin (MULTIVITAMIN) tablet, Take 1 tablet by mouth daily., Disp: , Rfl:    predniSONE (DELTASONE) 10 MG tablet, Take 10 mg  by mouth daily with breakfast., Disp: , Rfl:    levonorgestrel -ethinyl estradiol (AVIANE) 0.1-20 MG-MCG tablet, Take 1 tablet by mouth once daily (Patient not taking: Reported on 07/03/2023), Disp: 84 tablet, Rfl: 3   Physical Exam:   BP 107/78 (BP Location: Left Arm, Patient Position: Sitting, Cuff Size: Large)   Pulse 85   Temp 98.4 F (36.9 C) (Oral)   Ht 5' 5 (1.651 m)   Wt 250 lb (113.4 kg)   SpO2 98%   BMI 41.60 kg/m   Pertinent Findings  CN II-XII intact Bilateral EAC clear and TM intact with well pneumatized middle ear spaces  Anterior rhinoscopy: Septum midline; bilateral inferior turbinates with hypertrophy No lesions of oral cavity/oropharynx; dentition within normal limits No obviously palpable neck masses/lymphadenopathy/thyromegaly No respiratory distress or stridor  Seprately Identifiable Procedures:  None  Impression & Plans:  Margaret Hudson is a 43 y.o. female with the following   Vertigo-  43 year old female seen today for evaluation of dizziness/vertigo.  She did have 1 episode consistent with vertigo, uncertain etiology.  Low suspicion for BPPV, no symptoms consistent with vestibular migraine, Mnire's disease or labyrinthitis.  Question vestibular neuritis.  Her other episodes are not consistent with vertigo although she does have ear fullness when having these.  Her audiological evaluation is reassuring with normal hearing with bilateral type a tympanometry.  Her symptoms have resolved.  At this point I would have the patient continue to monitor for any changes, happy to see her back at any point.   - f/u PRN   Thank you for allowing me the opportunity to care for your patient. Please do not hesitate to contact me should you have any other questions.  Sincerely, Chyrl Cohen PA-C [facility] Phone: [phone] Fax: [phone]  11/10/2023, 8:59 AM

## 2023-11-18 ENCOUNTER — Encounter: Payer: Self-pay | Admitting: Audiology

## 2023-11-19 ENCOUNTER — Other Ambulatory Visit: Payer: Self-pay | Admitting: Medical Genetics

## 2023-11-19 DIAGNOSIS — Z006 Encounter for examination for normal comparison and control in clinical research program: Secondary | ICD-10-CM

## 2023-11-26 ENCOUNTER — Encounter: Payer: Self-pay | Admitting: Gastroenterology

## 2023-12-03 DIAGNOSIS — M329 Systemic lupus erythematosus, unspecified: Secondary | ICD-10-CM | POA: Diagnosis not present

## 2023-12-04 DIAGNOSIS — Z79899 Other long term (current) drug therapy: Secondary | ICD-10-CM | POA: Diagnosis not present

## 2023-12-04 DIAGNOSIS — M329 Systemic lupus erythematosus, unspecified: Secondary | ICD-10-CM | POA: Diagnosis not present

## 2023-12-04 DIAGNOSIS — L930 Discoid lupus erythematosus: Secondary | ICD-10-CM | POA: Diagnosis not present

## 2023-12-04 DIAGNOSIS — Z23 Encounter for immunization: Secondary | ICD-10-CM | POA: Diagnosis not present

## 2023-12-10 ENCOUNTER — Other Ambulatory Visit: Payer: Self-pay | Admitting: Nurse Practitioner

## 2023-12-10 DIAGNOSIS — R718 Other abnormality of red blood cells: Secondary | ICD-10-CM

## 2023-12-10 NOTE — Telephone Encounter (Signed)
 Please advise [facility]

## 2023-12-15 ENCOUNTER — Ambulatory Visit (INDEPENDENT_AMBULATORY_CARE_PROVIDER_SITE_OTHER): Payer: Self-pay | Admitting: Nurse Practitioner

## 2023-12-15 ENCOUNTER — Encounter: Payer: Self-pay | Admitting: Nurse Practitioner

## 2023-12-15 VITALS — BP 128/71 | HR 84 | Wt 254.0 lb

## 2023-12-15 DIAGNOSIS — Z114 Encounter for screening for human immunodeficiency virus [HIV]: Secondary | ICD-10-CM

## 2023-12-15 DIAGNOSIS — E7800 Pure hypercholesterolemia, unspecified: Secondary | ICD-10-CM | POA: Diagnosis not present

## 2023-12-15 DIAGNOSIS — Z1159 Encounter for screening for other viral diseases: Secondary | ICD-10-CM

## 2023-12-15 DIAGNOSIS — Z8673 Personal history of transient ischemic attack (TIA), and cerebral infarction without residual deficits: Secondary | ICD-10-CM

## 2023-12-15 DIAGNOSIS — E559 Vitamin D deficiency, unspecified: Secondary | ICD-10-CM | POA: Diagnosis not present

## 2023-12-15 DIAGNOSIS — D509 Iron deficiency anemia, unspecified: Secondary | ICD-10-CM | POA: Diagnosis not present

## 2023-12-15 DIAGNOSIS — R42 Dizziness and giddiness: Secondary | ICD-10-CM

## 2023-12-15 DIAGNOSIS — Z0000 Encounter for general adult medical examination without abnormal findings: Secondary | ICD-10-CM | POA: Diagnosis not present

## 2023-12-15 NOTE — Patient Instructions (Signed)
 1. Vitamin D deficiency (Primary)  - VITAMIN D 25 Hydroxy (Vit-D Deficiency, Fractures)  2. Screening for lipid disorders  - Lipid panel  3. Iron  deficiency anemia, unspecified iron  deficiency anemia type  - CBC - Iron , TIBC and Ferritin Panel  4. Screening for HIV (human immunodeficiency virus)  - HIV antibody (with reflex)    It is important that you exercise regularly at least 30 minutes 5 times a week as tolerated  Think about what you will eat, plan ahead. Choose  clean, green, fresh or frozen over canned, processed or packaged foods which are more sugary, salty and fatty. 70 to 75% of food eaten should be vegetables and fruit. Three meals at set times with snacks allowed between meals, but they must be fruit or vegetables. Aim to eat over a 12 hour period , example 7 am to 7 pm, and STOP after  your last meal of the day. Drink water,generally about 64 ounces per day, no other drink is as healthy. Fruit juice is best enjoyed in a healthy way, by EATING the fruit.  Thanks for choosing [facility] we consider it a privelige to serve you.

## 2023-12-15 NOTE — Assessment & Plan Note (Signed)
 History of transient ischemic attack (TIA) History of TIA with current normal blood pressure. Discussed importance of monitoring cholesterol levels to reduce stroke risk. - Ordered lipid panel.

## 2023-12-15 NOTE — Assessment & Plan Note (Signed)
  Managed with 1000 IU daily supplementation for five years. - Ordered vitamin D level.

## 2023-12-15 NOTE — Assessment & Plan Note (Signed)
 Annual exam as documented.  Counseling done include healthy lifestyle involving committing to 150 minutes of exercise per week, heart healthy diet, and attaining healthy weight. The importance of adequate sleep also discussed.  Regular use of seat belt and home safety were also discussed . Changes in health habits are decided on by patient with goals and time frames set for achieving them. Immunization and cancer screening  needs are specifically addressed at this visit.    - Ensure colon cancer screening is completed in December. Unsure of hep B vaccination status, checking labs Up-to-date with mammogram

## 2023-12-15 NOTE — Assessment & Plan Note (Signed)
 Lab Results  Component Value Date   IRON  37 09/22/2023   TIBC 267 09/22/2023   FERRITIN 46 09/22/2023   Previous microcytic anemia on ferrous sulfate . No vitamin C taken with iron . - Ordered CBC and iron  panel. - Advised taking ferrous sulfate  325 mg daily with vitamin C to improve absorption.

## 2023-12-15 NOTE — Assessment & Plan Note (Signed)
 Counseled on heart healthy low-fat diet Encouraged to lose weight

## 2023-12-15 NOTE — Assessment & Plan Note (Addendum)
 Wt Readings from Last 3 Encounters:  12/15/23 254 lb (115.2 kg)  11/10/23 250 lb (113.4 kg)  09/10/23 258 lb (117 kg)   Body mass index is 42.27 kg/m.  Counseled on low-carb diet Encouraged moderate to vigorous exercises at least 30 minutes 5 days a week as tolerated Benefits of healthy weights discussed

## 2023-12-15 NOTE — Assessment & Plan Note (Signed)
 Now resolved.

## 2023-12-15 NOTE — Addendum Note (Signed)
 Addended by: JUANICE THOMES SAUNDERS on: 12/15/2023 12:55 PM   Modules accepted: Orders

## 2023-12-15 NOTE — Progress Notes (Signed)
 Complete physical exam  Patient: Margaret Hudson   DOB: July 14, 1980   43 y.o. Female  MRN: [id number]  Subjective:    Chief Complaint  Patient presents with   Annual Exam      Discussed the use of AI scribe software for clinical note transcription with the patient, who gave verbal consent to proceed.  History of Present Illness Margaret Hudson is a 43 year old female who presents for an adult physical exam.  She has a history of iron  deficiency anemia and is currently taking ferrous sulfate , although not with vitamin C. She also takes vitamin D 1000 units daily for previously diagnosed low levels. For lupus, she continues Plaquenil 400 mg daily and has reduced prednisone to 5 mg daily. She keeps meclizine  on hand but has not needed it recently.  She is physically active, using an exercise bike five days a week for 30 minutes each session. 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 She pays attention to her diet, avoiding fried foods and cutting back on sweets. She does not smoke, use drugs, or vape, and consumes alcohol occasionally.  No recent allergies or symptoms such as sneezing, runny nose, or stuffy nose, although she experienced epistaxis a few weeks ago. No fever, chills, chest pain, shortness of breath, nausea, or vomiting. She had a TIA in the past, and her cholesterol levels are being monitored as part of her risk management for stroke prevention.  She had a normal mammogram in September and is scheduled for a colon cancer screening in December. She had a hepatitis C test in 2022, which was normal, but has not had an HIV test. She moved to [place], [place] , in 2019 and believes she may have received a hepatitis B vaccine since then.    Assessment & Plan     Most recent fall risk assessment:     No data to display           Most recent depression screenings:    12/15/2023    8:35 AM 04/16/2023    9:18  AM  PHQ 2/9 Scores  PHQ - 2 Score 0 0  PHQ- 9 Score 0 2      Data saved with a previous flowsheet row definition        Patient Care Team: Aydee Mcnew R, FNP as PCP - General (Nurse Practitioner)   Outpatient Medications Prior to Visit  Medication Sig   Cholecalciferol 25 MCG (1000 UT) capsule Take 1,000 Units by mouth daily.   FEROSUL 325 (65 Fe) MG tablet Take 1 tablet by mouth once daily   hydroxychloroquine (PLAQUENIL) 200 MG tablet Take 2 tablets by mouth daily.   meclizine  (ANTIVERT ) 12.5 MG tablet Take 1 tablet (12.5 mg total) by mouth 3 (three) times daily as needed for dizziness.   Multiple Vitamin (MULTIVITAMIN) tablet Take 1 tablet by mouth daily.   predniSONE (DELTASONE) 10 MG tablet Take 10 mg by mouth daily with breakfast.   levonorgestrel -ethinyl estradiol (AVIANE) 0.1-20 MG-MCG tablet Take 1 tablet by mouth once daily (Patient not taking: Reported on 07/03/2023)   No facility-administered medications prior to visit.    Review of Systems  Constitutional:  Negative for appetite change, chills, fatigue and fever.  HENT:  Negative for congestion, postnasal drip, rhinorrhea and sneezing.   Eyes:  Negative for pain, discharge and itching.  Respiratory:  Negative for cough, shortness of breath and wheezing.   Cardiovascular:  Negative for chest pain, palpitations and leg swelling.  Gastrointestinal:  Negative for abdominal pain, constipation, nausea and vomiting.  Endocrine: Negative for cold intolerance, heat intolerance and polydipsia.  Genitourinary:  Negative for difficulty urinating, dysuria, flank pain and frequency.  Musculoskeletal:  Negative for arthralgias, back pain, joint swelling and myalgias.  Skin:  Negative for color change, pallor, rash and wound.  Neurological:  Negative for dizziness, facial asymmetry, weakness, numbness and headaches.  Psychiatric/Behavioral:  Negative for behavioral problems, confusion, self-injury and suicidal ideas.         Objective:     BP 128/71   Pulse 84   Wt 254 lb (115.2 kg)   SpO2 100%   BMI 42.27 kg/m    Physical Exam Vitals and nursing note reviewed.  Constitutional:      General: She is not in acute distress.    Appearance: Normal appearance. She is obese. She is not ill-appearing, toxic-appearing or diaphoretic.  HENT:     Right Ear: Tympanic membrane, ear canal and external ear normal. There is no impacted cerumen.     Left Ear: Tympanic membrane, ear canal and external ear normal. There is no impacted cerumen.     Nose: Nose normal. No congestion or rhinorrhea.     Mouth/Throat:     Mouth: Mucous membranes are moist.     Pharynx: Oropharynx is clear. No oropharyngeal exudate or posterior oropharyngeal erythema.  Eyes:     General: No scleral icterus.       Right eye: No discharge.        Left eye: No discharge.     Extraocular Movements: Extraocular movements intact.     Conjunctiva/sclera: Conjunctivae normal.  Neck:     Vascular: No carotid bruit.  Cardiovascular:     Rate and Rhythm: Normal rate and regular rhythm.     Pulses: Normal pulses.     Heart sounds: Normal heart sounds. No murmur heard.    No friction rub. No gallop.  Pulmonary:     Effort: Pulmonary effort is normal. No respiratory distress.     Breath sounds: Normal breath sounds. No stridor. No wheezing, rhonchi or rales.  Chest:     Chest wall: No tenderness.  Abdominal:     General: Bowel sounds are normal. There is no distension.     Palpations: Abdomen is soft. There is no mass.     Tenderness: There is no abdominal tenderness. There is no right CVA tenderness, left CVA tenderness, guarding or rebound.     Hernia: No hernia is present.  Musculoskeletal:        General: No swelling, tenderness, deformity or signs of injury.     Cervical back: Normal range of motion and neck supple. No rigidity or tenderness.     Right lower leg: No edema.     Left lower leg: No edema.  Lymphadenopathy:     Cervical:  No cervical adenopathy.  Skin:    General: Skin is warm and dry.     Capillary Refill: Capillary refill takes less than 2 seconds.     Coloration: Skin is not jaundiced or pale.     Findings: Rash present. No bruising, erythema or lesion.     Comments: Lupus Rash on the face  Neurological:     Mental Status: She is alert and oriented to person, place, and time.     Cranial Nerves: No cranial nerve deficit.     Motor: No weakness.     Gait:  Gait normal.  Psychiatric:        Mood and Affect: Mood normal.        Behavior: Behavior normal.        Thought Content: Thought content normal.        Judgment: Judgment normal.     No results found for any visits on 12/15/23.     Assessment & Plan:    Routine Health Maintenance and Physical Exam  Immunization History  Administered Date(s) Administered   HPV 9-valent 03/14/2021, 05/14/2021, 09/17/2021   Influenza,inj,Quad PF,6+ Mos 01/10/2018, 11/25/2019, 10/24/2021   Influenza-Unspecified 10/16/2020, 12/04/2023   Moderna Sars-Covid-2 Vaccination 04/19/2019, 05/10/2019, 11/25/2019   Tdap 06/07/2019    Health Maintenance  Topic Date Due   HIV Screening  Never done   Hepatitis B Vaccines 19-59 Average Risk (1 of 3 - 19+ 3-dose series) Never done   Colonoscopy  Never done   COVID-19 Vaccine (4 - 2025-26 season) 09/29/2023   Mammogram  10/02/2025   Cervical Cancer Screening (HPV/Pap Cotest)  04/15/2028   DTaP/Tdap/Td (2 - Td or Tdap) 06/06/2029   Influenza Vaccine  Completed   HPV VACCINES  Completed   Pneumococcal Vaccine  Aged Out   Meningococcal B Vaccine  Aged Out   Hepatitis C Screening  Discontinued    Discussed health benefits of physical activity, and encouraged her to engage in regular exercise appropriate for her age and condition.  Problem List Items Addressed This Visit       Other   Morbid obesity (HCC)   Wt Readings from Last 3 Encounters:  12/15/23 254 lb (115.2 kg)  11/10/23 250 lb (113.4 kg)  09/10/23 258  lb (117 kg)   Body mass index is 42.27 kg/m.  Counseled on low-carb diet Encouraged moderate to vigorous exercises at least 30 minutes 5 days a week as tolerated Benefits of healthy weights discussed      Hypercholesterolemia   Counseled on heart healthy low-fat diet Encouraged to lose weight      Relevant Orders   Lipid panel   Dizziness   Now resolved      Anemia   Lab Results  Component Value Date   IRON  37 09/22/2023   TIBC 267 09/22/2023   FERRITIN 46 09/22/2023   Previous microcytic anemia on ferrous sulfate . No vitamin C taken with iron . - Ordered CBC and iron  panel. - Advised taking ferrous sulfate  325 mg daily with vitamin C to improve absorption.       Relevant Orders   CBC   Iron , TIBC and Ferritin Panel   History of TIA (transient ischemic attack)   History of transient ischemic attack (TIA) History of TIA with current normal blood pressure. Discussed importance of monitoring cholesterol levels to reduce stroke risk. - Ordered lipid panel.      Relevant Orders   Lipid panel   Annual physical exam - Primary   Annual exam as documented.  Counseling done include healthy lifestyle involving committing to 150 minutes of exercise per week, heart healthy diet, and attaining healthy weight. The importance of adequate sleep also discussed.  Regular use of seat belt and home safety were also discussed . Changes in health habits are decided on by patient with goals and time frames set for achieving them. Immunization and cancer screening  needs are specifically addressed at this visit.    - Ensure colon cancer screening is completed in December. Unsure of hep B vaccination status, checking labs Up-to-date with mammogram  Vitamin D deficiency    Managed with 1000 IU daily supplementation for five years. - Ordered vitamin D level.         Relevant Orders   VITAMIN D 25 Hydroxy (Vit-D Deficiency, Fractures)   Other Visit Diagnoses       Screening  for HIV (human immunodeficiency virus)       Relevant Orders   HIV antibody (with reflex)     Need for hepatitis B screening test       Relevant Orders   Hepatitis B core antibody, IgM   Hepatitis B surface antibody,qualitative   Hepatitis B surface antigen      Return in about 3 months (around 03/16/2024) for anemia.     Kadeisha Betsch R Elysa Womac, FNP

## 2023-12-16 ENCOUNTER — Ambulatory Visit: Payer: Self-pay | Admitting: Nurse Practitioner

## 2023-12-16 DIAGNOSIS — Z8673 Personal history of transient ischemic attack (TIA), and cerebral infarction without residual deficits: Secondary | ICD-10-CM

## 2023-12-16 DIAGNOSIS — E785 Hyperlipidemia, unspecified: Secondary | ICD-10-CM

## 2023-12-16 LAB — LIPID PANEL
Chol/HDL Ratio: 2.6 ratio (ref 0.0–4.4)
Cholesterol, Total: 206 mg/dL — ABNORMAL HIGH (ref 100–199)
HDL: 79 mg/dL (ref >39)
LDL Chol Calc (NIH): 115 mg/dL — ABNORMAL HIGH (ref 0–99)
Triglycerides: 67 mg/dL (ref 0–149)
VLDL Cholesterol Cal: 12 mg/dL (ref 5–40)

## 2023-12-16 LAB — IRON,TIBC AND FERRITIN PANEL
Ferritin: 64 ng/mL (ref 15–150)
Iron Saturation: 19 % (ref 15–55)
Iron: 47 ug/dL (ref 27–159)
Total Iron Binding Capacity: 254 ug/dL (ref 250–450)
UIBC: 207 ug/dL (ref 131–425)

## 2023-12-16 LAB — CBC
Hematocrit: 40.4 % (ref 34.0–46.6)
Hemoglobin: 12.2 g/dL (ref 11.1–15.9)
MCH: 24.4 pg — ABNORMAL LOW (ref 26.6–33.0)
MCHC: 30.2 g/dL — ABNORMAL LOW (ref 31.5–35.7)
MCV: 81 fL (ref 79–97)
Platelets: 258 x10E3/uL (ref 150–450)
RBC: 4.99 x10E6/uL (ref 3.77–5.28)
RDW: 13.8 % (ref 11.7–15.4)
WBC: 5.5 x10E3/uL (ref 3.4–10.8)

## 2023-12-16 LAB — HIV ANTIBODY (ROUTINE TESTING W REFLEX): HIV Screen 4th Generation wRfx: NONREACTIVE

## 2023-12-16 LAB — HEPATITIS B SURFACE ANTIGEN: Hepatitis B Surface Ag: NEGATIVE

## 2023-12-16 LAB — HEPATITIS B CORE ANTIBODY, IGM: Hep B C IgM: NEGATIVE

## 2023-12-16 LAB — HEPATITIS B SURFACE ANTIBODY,QUALITATIVE: Hep B Surface Ab, Qual: NONREACTIVE

## 2023-12-16 LAB — VITAMIN D 25 HYDROXY (VIT D DEFICIENCY, FRACTURES): Vit D, 25-Hydroxy: 55.7 ng/mL (ref 30.0–100.0)

## 2023-12-16 MED ORDER — ROSUVASTATIN CALCIUM 10 MG PO TABS: 10.0000 mg | ORAL_TABLET | Freq: Every day | ORAL | 3 refills | Status: AC

## 2023-12-18 ENCOUNTER — Other Ambulatory Visit: Payer: Self-pay

## 2023-12-18 ENCOUNTER — Ambulatory Visit (AMBULATORY_SURGERY_CENTER)

## 2023-12-18 VITALS — Ht 65.0 in | Wt 250.0 lb

## 2023-12-18 DIAGNOSIS — Z1211 Encounter for screening for malignant neoplasm of colon: Secondary | ICD-10-CM

## 2023-12-18 MED ORDER — NA SULFATE-K SULFATE-MG SULF 17.5-3.13-1.6 GM/177ML PO SOLN: 1.0000 | Freq: Once | ORAL | 0 refills | Status: AC

## 2023-12-18 NOTE — Progress Notes (Signed)
 Denies allergies to eggs or soy products. Denies complication of anesthesia or sedation. Denies use of weight loss medication. Denies use of O2.   Emmi instructions given for colonoscopy.

## 2023-12-31 DIAGNOSIS — M329 Systemic lupus erythematosus, unspecified: Secondary | ICD-10-CM | POA: Diagnosis not present

## 2024-01-01 ENCOUNTER — Ambulatory Visit: Admitting: Gastroenterology

## 2024-01-01 ENCOUNTER — Encounter: Payer: Self-pay | Admitting: Gastroenterology

## 2024-01-01 VITALS — BP 120/74 | HR 66 | Temp 97.5°F | Resp 15 | Ht 65.0 in | Wt 250.0 lb

## 2024-01-01 DIAGNOSIS — Z1211 Encounter for screening for malignant neoplasm of colon: Secondary | ICD-10-CM | POA: Diagnosis not present

## 2024-01-01 DIAGNOSIS — K648 Other hemorrhoids: Secondary | ICD-10-CM | POA: Diagnosis not present

## 2024-01-01 MED ORDER — SODIUM CHLORIDE 0.9 % IV SOLN: 500.0000 mL | Freq: Once | INTRAVENOUS | Status: DC

## 2024-01-01 NOTE — Progress Notes (Signed)
 Pt's states no medical or surgical changes since previsit or office visit.

## 2024-01-01 NOTE — Patient Instructions (Signed)

## 2024-01-01 NOTE — Progress Notes (Signed)
 Report to PACU, RN, vss, BBS= Clear.

## 2024-01-01 NOTE — Progress Notes (Signed)
 History and Physical:  This patient presents for endoscopic testing for: Encounter Diagnosis  Name Primary?   Special screening for malignant neoplasms, colon Yes    Average risk for colorectal cancer.  1st screening exam.    Patient is otherwise without complaints or active issues today.   Past Medical History: Past Medical History:  Diagnosis Date   Allergy    Anemia 07/2023   Unsure   Arthritis 2025   Hyperlipidemia    Lupus (systemic lupus erythematosus) (HCC)    Stroke (HCC) 6 /2025   Mini stroke   Vaginal Pap smear, abnormal    TIA June 2025 Lupus  Past Surgical History: Past Surgical History:  Procedure Laterality Date   BREAST BIOPSY Right 09/28/2021   BREAST BIOPSY Left 09/28/2021   BREAST BIOPSY Left 01/01/2022   CHOLECYSTECTOMY  01/2017   galbladder      Allergies: No Known Allergies  Outpatient Meds: Current Outpatient Medications  Medication Sig Dispense Refill   anifrolumab -fnia (SAPHNELO ) 300 MG/2ML SOLN injection Inject 300 mg into the vein every 30 (thirty) days.     Cholecalciferol 25 MCG (1000 UT) capsule Take 1,000 Units by mouth daily.     hydroxychloroquine (PLAQUENIL) 200 MG tablet Take 2 tablets by mouth daily.     Multiple Vitamin (MULTIVITAMIN) tablet Take 1 tablet by mouth daily.     predniSONE (DELTASONE) 10 MG tablet Take 10 mg by mouth daily with breakfast.     rosuvastatin  (CRESTOR ) 10 MG tablet Take 1 tablet (10 mg total) by mouth daily. 90 tablet 3   FEROSUL 325 (65 Fe) MG tablet Take 1 tablet by mouth once daily 30 tablet 0   meclizine  (ANTIVERT ) 12.5 MG tablet Take 1 tablet (12.5 mg total) by mouth 3 (three) times daily as needed for dizziness. 30 tablet 0   Current Facility-Administered Medications  Medication Dose Route Frequency Provider Last Rate Last Admin   0.9 %  sodium chloride  infusion  500 mL Intravenous Once Danis, Victory CROME III, MD           ___________________________________________________________________ Objective   Exam:  BP 120/72   Pulse 70   Temp (!) 97.5 F (36.4 C) (Skin)   Ht 5' 5 (1.651 m)   Wt 250 lb (113.4 kg)   LMP 11/12/2023   SpO2 100%   BMI 41.60 kg/m   CV: regular , S1/S2 Resp: clear to auscultation bilaterally, normal RR and effort noted GI: soft, no tenderness, with active bowel sounds.   Assessment: Encounter Diagnosis  Name Primary?   Special screening for malignant neoplasms, colon Yes     Plan: Colonoscopy   The benefits and risks of the planned procedure(s) were described in detail with the patient or (when appropriate) their health care proxy.  Risks were outlined as including, but not limited to, bleeding, infection, perforation, adverse medication reaction leading to cardiac or pulmonary decompensation, pancreatitis (if ERCP).  The limitation of incomplete mucosal visualization was also discussed.  No guarantees or warranties were given.  The patient was provided an opportunity to ask questions and all were answered. The patient agreed with the plan.   The patient is appropriate for an endoscopic procedure in the ambulatory setting.   - Victory Brand, MD

## 2024-01-01 NOTE — Op Note (Signed)
 [facility] Patient Name: Margaret Hudson Procedure Date: 01/01/2024 10:58 AM MRN: [id number] Endoscopist: Victory L. Legrand , MD, [id number] Age: 43 Referring MD:  Date of Birth: 02-04-1980 Gender: Female Account #: [id number] Procedure:                Colonoscopy Indications:              Screening for colorectal malignant neoplasm, This                            is the patient's first colonoscopy Medicines:                Monitored Anesthesia Care Procedure:                Pre-Anesthesia Assessment:                           - Prior to the procedure, a History and Physical                            was performed, and patient medications and                            allergies were reviewed. The patient's tolerance of                            previous anesthesia was also reviewed. The risks                            and benefits of the procedure and the sedation                            options and risks were discussed with the patient.                            All questions were answered, and informed consent                            was obtained. Prior Anticoagulants: The patient has                            taken no anticoagulant or antiplatelet agents. ASA                            Grade Assessment: III - A patient with severe                            systemic disease. After reviewing the risks and                            benefits, the patient was deemed in satisfactory                            condition to undergo the procedure.  After obtaining informed consent, the colonoscope                            was passed under direct vision. Throughout the                            procedure, the patient's blood pressure, pulse, and                            oxygen saturations were monitored continuously. The                            Olympus CF-HQ190L ([id number]) Colonoscope was                            introduced through  the anus and advanced to the the                            cecum, identified by appendiceal orifice and                            ileocecal valve. The colonoscopy was performed                            without difficulty. The patient tolerated the                            procedure well. The quality of the bowel                            preparation was excellent. The ileocecal valve,                            appendiceal orifice, and rectum were photographed. Scope In: 11:11:20 AM Scope Out: 11:24:38 AM Scope Withdrawal Time: 0 hours 9 minutes 2 seconds  Total Procedure Duration: 0 hours 13 minutes 18 seconds  Findings:                 The perianal and digital rectal examinations were                            normal.                           Repeat examination of right colon under NBI                            performed.                           Internal hemorrhoids were found. The hemorrhoids                            were small.  The exam was otherwise without abnormality on                            direct and retroflexion views. Complications:            No immediate complications. Estimated Blood Loss:     Estimated blood loss: none. Impression:               - Internal hemorrhoids.                           - The examination was otherwise normal on direct                            and retroflexion views.                           - No specimens collected. Recommendation:           - Patient has a contact number available for                            emergencies. The signs and symptoms of potential                            delayed complications were discussed with the                            patient. Return to normal activities tomorrow.                            Written discharge instructions were provided to the                            patient.                           - Resume previous diet.                           -  Continue present medications.                           - Repeat colonoscopy in 10 years for screening                            purposes. Dacey Milberger L. Legrand, MD 01/01/2024 11:30:32 AM This report has been signed electronically.

## 2024-01-02 ENCOUNTER — Telehealth: Payer: Self-pay

## 2024-01-02 NOTE — Telephone Encounter (Signed)
 Attempted to reach patient for post-procedure f/u call. No answer. Left message for her to please not  hesitate to call she has questions/concerns regarding her care.

## 2024-01-09 ENCOUNTER — Other Ambulatory Visit: Payer: Self-pay | Admitting: Nurse Practitioner

## 2024-01-09 DIAGNOSIS — R718 Other abnormality of red blood cells: Secondary | ICD-10-CM

## 2024-01-27 DIAGNOSIS — L930 Discoid lupus erythematosus: Secondary | ICD-10-CM | POA: Diagnosis not present

## 2024-02-03 ENCOUNTER — Encounter: Payer: Self-pay | Admitting: Family Medicine

## 2024-02-04 MED ORDER — NORETHINDRONE 0.35 MG PO TABS: 1.0000 | ORAL_TABLET | Freq: Every day | ORAL | 3 refills | Status: AC

## 2024-02-05 ENCOUNTER — Other Ambulatory Visit: Payer: Self-pay | Admitting: Nurse Practitioner

## 2024-02-05 DIAGNOSIS — R718 Other abnormality of red blood cells: Secondary | ICD-10-CM

## 2024-02-05 NOTE — Telephone Encounter (Signed)
 FEROSUL 325 (65 Fe) MG tablet [Pharmacy Med Name: FeroSul 325 (65 Fe) MG Oral Tablet]

## 2024-03-04 ENCOUNTER — Other Ambulatory Visit: Payer: Self-pay | Admitting: Nurse Practitioner

## 2024-03-04 DIAGNOSIS — R718 Other abnormality of red blood cells: Secondary | ICD-10-CM

## 2024-03-04 NOTE — Telephone Encounter (Signed)
 FEROSUL 325 (65 Fe) MG tablet [Pharmacy Med Name: FeroSul 325 (65 Fe) MG Oral Tablet]

## 2024-03-16 ENCOUNTER — Ambulatory Visit: Payer: Self-pay | Admitting: Nurse Practitioner

## 2024-05-13 ENCOUNTER — Ambulatory Visit: Admitting: Family Medicine
